# Patient Record
Sex: Male | Born: 1970 | Race: White | Hispanic: No | Marital: Married | Smoking: Former smoker
Health system: Southern US, Community
[De-identification: ages and names within clinical notes are randomized; demographics above are authoritative.]

## PROBLEM LIST (undated history)

## (undated) DIAGNOSIS — T7840XA Allergy, unspecified, initial encounter: Secondary | ICD-10-CM

## (undated) DIAGNOSIS — D869 Sarcoidosis, unspecified: Secondary | ICD-10-CM

## (undated) DIAGNOSIS — K219 Gastro-esophageal reflux disease without esophagitis: Secondary | ICD-10-CM

## (undated) DIAGNOSIS — E785 Hyperlipidemia, unspecified: Secondary | ICD-10-CM

## (undated) DIAGNOSIS — I10 Essential (primary) hypertension: Secondary | ICD-10-CM

## (undated) DIAGNOSIS — M109 Gout, unspecified: Secondary | ICD-10-CM

## (undated) HISTORY — DX: Gastro-esophageal reflux disease without esophagitis: K21.9

## (undated) HISTORY — DX: Hyperlipidemia, unspecified: E78.5

## (undated) HISTORY — DX: Essential (primary) hypertension: I10

## (undated) HISTORY — DX: Gout, unspecified: M10.9

## (undated) HISTORY — DX: Sarcoidosis, unspecified: D86.9

## (undated) HISTORY — DX: Allergy, unspecified, initial encounter: T78.40XA

---

## 1990-10-30 ENCOUNTER — Encounter: Payer: Self-pay | Admitting: Internal Medicine

## 1998-07-01 ENCOUNTER — Emergency Department (HOSPITAL_COMMUNITY): Admission: EM | Admit: 1998-07-01 | Discharge: 1998-07-01 | Payer: Self-pay | Admitting: Emergency Medicine

## 1998-07-01 ENCOUNTER — Encounter: Payer: Self-pay | Admitting: Emergency Medicine

## 2004-01-20 ENCOUNTER — Ambulatory Visit: Payer: Self-pay | Admitting: Internal Medicine

## 2004-08-30 ENCOUNTER — Ambulatory Visit: Payer: Self-pay | Admitting: Internal Medicine

## 2004-12-19 ENCOUNTER — Ambulatory Visit: Payer: Self-pay | Admitting: Internal Medicine

## 2004-12-26 ENCOUNTER — Ambulatory Visit: Payer: Self-pay | Admitting: Internal Medicine

## 2005-01-26 ENCOUNTER — Ambulatory Visit: Payer: Self-pay | Admitting: Internal Medicine

## 2005-03-14 ENCOUNTER — Ambulatory Visit: Payer: Self-pay | Admitting: Internal Medicine

## 2005-04-14 ENCOUNTER — Ambulatory Visit: Payer: Self-pay | Admitting: Internal Medicine

## 2005-04-28 ENCOUNTER — Ambulatory Visit: Payer: Self-pay | Admitting: Internal Medicine

## 2005-10-11 ENCOUNTER — Ambulatory Visit: Payer: Self-pay | Admitting: Internal Medicine

## 2005-11-01 ENCOUNTER — Ambulatory Visit: Payer: Self-pay | Admitting: Internal Medicine

## 2006-03-13 ENCOUNTER — Ambulatory Visit: Payer: Self-pay | Admitting: Internal Medicine

## 2006-05-21 ENCOUNTER — Encounter: Payer: Self-pay | Admitting: Internal Medicine

## 2006-06-06 ENCOUNTER — Emergency Department (HOSPITAL_COMMUNITY): Admission: EM | Admit: 2006-06-06 | Discharge: 2006-06-07 | Payer: Self-pay | Admitting: Emergency Medicine

## 2006-06-08 ENCOUNTER — Ambulatory Visit: Payer: Self-pay | Admitting: Internal Medicine

## 2006-08-20 ENCOUNTER — Encounter (INDEPENDENT_AMBULATORY_CARE_PROVIDER_SITE_OTHER): Payer: Self-pay | Admitting: *Deleted

## 2006-08-27 ENCOUNTER — Telehealth (INDEPENDENT_AMBULATORY_CARE_PROVIDER_SITE_OTHER): Payer: Self-pay | Admitting: *Deleted

## 2006-08-31 ENCOUNTER — Ambulatory Visit: Payer: Self-pay | Admitting: Internal Medicine

## 2006-08-31 ENCOUNTER — Encounter: Payer: Self-pay | Admitting: Family Medicine

## 2006-08-31 DIAGNOSIS — K219 Gastro-esophageal reflux disease without esophagitis: Secondary | ICD-10-CM

## 2006-09-03 LAB — CONVERTED CEMR LAB
Albumin: 4 g/dL (ref 3.5–5.2)
BUN: 17 mg/dL (ref 6–23)
CO2: 25 meq/L (ref 19–32)
Calcium: 9.3 mg/dL (ref 8.4–10.5)
Chloride: 105 meq/L (ref 96–112)
Cholesterol: 196 mg/dL (ref 0–200)
Creatinine, Ser: 1 mg/dL (ref 0.4–1.5)
GFR calc Af Amer: 108 mL/min
GFR calc non Af Amer: 89 mL/min
Glucose, Bld: 92 mg/dL (ref 70–99)
HDL: 36.7 mg/dL — ABNORMAL LOW (ref >39.0)
LDL Cholesterol: 128 mg/dL — ABNORMAL HIGH (ref 0–99)
Phosphorus: 2.2 mg/dL — ABNORMAL LOW (ref 2.3–4.6)
Potassium: 4.1 meq/L (ref 3.5–5.1)
Sodium: 139 meq/L (ref 135–145)
Total CHOL/HDL Ratio: 5.3
Triglycerides: 156 mg/dL — ABNORMAL HIGH (ref 0–149)
VLDL: 31 mg/dL (ref 0–40)

## 2006-10-04 ENCOUNTER — Telehealth: Payer: Self-pay | Admitting: Internal Medicine

## 2006-10-05 ENCOUNTER — Ambulatory Visit: Payer: Self-pay | Admitting: Internal Medicine

## 2006-10-24 ENCOUNTER — Ambulatory Visit: Payer: Self-pay | Admitting: Internal Medicine

## 2006-11-13 ENCOUNTER — Ambulatory Visit: Payer: Self-pay | Admitting: Internal Medicine

## 2006-12-31 ENCOUNTER — Ambulatory Visit: Payer: Self-pay | Admitting: Family Medicine

## 2007-01-24 ENCOUNTER — Telehealth (INDEPENDENT_AMBULATORY_CARE_PROVIDER_SITE_OTHER): Payer: Self-pay | Admitting: *Deleted

## 2007-02-27 ENCOUNTER — Telehealth: Payer: Self-pay | Admitting: Internal Medicine

## 2007-04-02 ENCOUNTER — Ambulatory Visit: Payer: Self-pay | Admitting: Internal Medicine

## 2007-04-19 ENCOUNTER — Telehealth: Payer: Self-pay | Admitting: Internal Medicine

## 2007-06-12 ENCOUNTER — Ambulatory Visit: Payer: Self-pay | Admitting: Internal Medicine

## 2007-07-30 ENCOUNTER — Telehealth: Payer: Self-pay | Admitting: Internal Medicine

## 2007-08-14 ENCOUNTER — Ambulatory Visit: Payer: Self-pay | Admitting: Internal Medicine

## 2007-10-01 ENCOUNTER — Telehealth (INDEPENDENT_AMBULATORY_CARE_PROVIDER_SITE_OTHER): Payer: Self-pay | Admitting: *Deleted

## 2007-10-03 ENCOUNTER — Telehealth: Payer: Self-pay | Admitting: Family Medicine

## 2007-10-30 ENCOUNTER — Telehealth: Payer: Self-pay | Admitting: Internal Medicine

## 2007-11-01 ENCOUNTER — Ambulatory Visit: Payer: Self-pay | Admitting: Family Medicine

## 2007-11-13 ENCOUNTER — Telehealth (INDEPENDENT_AMBULATORY_CARE_PROVIDER_SITE_OTHER): Payer: Self-pay | Admitting: Internal Medicine

## 2007-11-13 ENCOUNTER — Ambulatory Visit: Payer: Self-pay | Admitting: Family Medicine

## 2007-11-14 ENCOUNTER — Telehealth: Payer: Self-pay | Admitting: Family Medicine

## 2007-11-19 ENCOUNTER — Ambulatory Visit: Payer: Self-pay | Admitting: Internal Medicine

## 2007-11-25 ENCOUNTER — Ambulatory Visit: Payer: Self-pay | Admitting: Family Medicine

## 2008-01-07 ENCOUNTER — Telehealth: Payer: Self-pay | Admitting: Internal Medicine

## 2008-01-17 ENCOUNTER — Ambulatory Visit: Payer: Self-pay | Admitting: Family Medicine

## 2008-01-28 ENCOUNTER — Ambulatory Visit: Payer: Self-pay | Admitting: Professional

## 2008-02-11 ENCOUNTER — Telehealth: Payer: Self-pay | Admitting: Internal Medicine

## 2008-02-12 ENCOUNTER — Ambulatory Visit: Payer: Self-pay | Admitting: Internal Medicine

## 2008-03-12 ENCOUNTER — Ambulatory Visit: Payer: Self-pay | Admitting: Internal Medicine

## 2008-05-14 ENCOUNTER — Telehealth: Payer: Self-pay | Admitting: Internal Medicine

## 2008-05-18 ENCOUNTER — Telehealth: Payer: Self-pay | Admitting: Internal Medicine

## 2008-07-08 ENCOUNTER — Telehealth: Payer: Self-pay | Admitting: Internal Medicine

## 2008-07-10 ENCOUNTER — Ambulatory Visit (HOSPITAL_COMMUNITY): Admission: RE | Admit: 2008-07-10 | Discharge: 2008-07-10 | Payer: Self-pay | Admitting: Internal Medicine

## 2008-07-10 ENCOUNTER — Ambulatory Visit: Payer: Self-pay | Admitting: Internal Medicine

## 2008-07-10 ENCOUNTER — Telehealth: Payer: Self-pay | Admitting: Internal Medicine

## 2008-08-05 ENCOUNTER — Telehealth: Payer: Self-pay | Admitting: Internal Medicine

## 2008-09-01 ENCOUNTER — Encounter: Payer: Self-pay | Admitting: Internal Medicine

## 2008-09-18 ENCOUNTER — Ambulatory Visit: Payer: Self-pay | Admitting: Internal Medicine

## 2008-09-21 ENCOUNTER — Encounter: Payer: Self-pay | Admitting: Internal Medicine

## 2008-09-22 LAB — CONVERTED CEMR LAB
BUN: 16 mg/dL (ref 6–23)
CO2: 28 meq/L (ref 19–32)
Calcium: 9 mg/dL (ref 8.4–10.5)
Chloride: 108 meq/L (ref 96–112)
Creatinine, Ser: 1 mg/dL (ref 0.4–1.5)
GFR calc non Af Amer: 87.8 mL/min (ref >60)
Glucose, Bld: 96 mg/dL (ref 70–99)
Potassium: 3.6 meq/L (ref 3.5–5.1)
Sodium: 141 meq/L (ref 135–145)

## 2008-10-05 ENCOUNTER — Telehealth: Payer: Self-pay | Admitting: Internal Medicine

## 2008-10-07 LAB — CONVERTED CEMR LAB: IgE (Immunoglobulin E), Serum: 136.9 intl units/mL (ref 0.0–180.0)

## 2008-10-08 ENCOUNTER — Telehealth: Payer: Self-pay | Admitting: Internal Medicine

## 2008-10-30 ENCOUNTER — Ambulatory Visit: Payer: Self-pay | Admitting: Internal Medicine

## 2008-11-12 ENCOUNTER — Telehealth: Payer: Self-pay | Admitting: Internal Medicine

## 2008-11-23 ENCOUNTER — Ambulatory Visit: Payer: Self-pay | Admitting: Internal Medicine

## 2008-12-18 ENCOUNTER — Telehealth: Payer: Self-pay | Admitting: Internal Medicine

## 2009-01-01 ENCOUNTER — Ambulatory Visit: Payer: Self-pay | Admitting: Internal Medicine

## 2009-01-05 ENCOUNTER — Encounter: Payer: Self-pay | Admitting: Internal Medicine

## 2009-01-05 LAB — CONVERTED CEMR LAB: IgE (Immunoglobulin E), Serum: 146.5 intl units/mL (ref 0.0–180.0)

## 2009-01-07 ENCOUNTER — Telehealth: Payer: Self-pay | Admitting: Internal Medicine

## 2009-01-18 ENCOUNTER — Telehealth: Payer: Self-pay | Admitting: Internal Medicine

## 2009-01-20 ENCOUNTER — Telehealth (INDEPENDENT_AMBULATORY_CARE_PROVIDER_SITE_OTHER): Payer: Self-pay | Admitting: *Deleted

## 2009-01-26 ENCOUNTER — Ambulatory Visit: Payer: Self-pay | Admitting: Internal Medicine

## 2009-02-02 ENCOUNTER — Telehealth (INDEPENDENT_AMBULATORY_CARE_PROVIDER_SITE_OTHER): Payer: Self-pay | Admitting: *Deleted

## 2009-02-17 ENCOUNTER — Ambulatory Visit: Payer: Self-pay | Admitting: Internal Medicine

## 2009-02-18 LAB — CONVERTED CEMR LAB
ALT: 66 units/L — ABNORMAL HIGH (ref 0–53)
AST: 46 units/L — ABNORMAL HIGH (ref 0–37)
Albumin: 4.4 g/dL (ref 3.5–5.2)
Alkaline Phosphatase: 41 units/L (ref 39–117)
BUN: 18 mg/dL (ref 6–23)
Basophils Absolute: 0 10*3/uL (ref 0.0–0.1)
Basophils Relative: 0.3 % (ref 0.0–3.0)
Bilirubin, Direct: 0 mg/dL (ref 0.0–0.3)
CO2: 27 meq/L (ref 19–32)
Calcium: 10 mg/dL (ref 8.4–10.5)
Chloride: 104 meq/L (ref 96–112)
Creatinine, Ser: 1.1 mg/dL (ref 0.4–1.5)
Eosinophils Absolute: 0 10*3/uL (ref 0.0–0.7)
Eosinophils Relative: 0.7 % (ref 0.0–5.0)
GFR calc non Af Amer: 78.49 mL/min (ref >60)
Glucose, Bld: 119 mg/dL — ABNORMAL HIGH (ref 70–99)
HCT: 45 % (ref 39.0–52.0)
Hemoglobin: 15 g/dL (ref 13.0–17.0)
Lymphocytes Relative: 21.8 % (ref 12.0–46.0)
Lymphs Abs: 1.3 10*3/uL (ref 0.7–4.0)
MCHC: 33.4 g/dL (ref 30.0–36.0)
MCV: 93.1 fL (ref 78.0–100.0)
Monocytes Absolute: 0.7 10*3/uL (ref 0.1–1.0)
Monocytes Relative: 12.3 % — ABNORMAL HIGH (ref 3.0–12.0)
Neutro Abs: 3.9 10*3/uL (ref 1.4–7.7)
Neutrophils Relative %: 64.9 % (ref 43.0–77.0)
Phosphorus: 2.4 mg/dL (ref 2.3–4.6)
Platelets: 210 10*3/uL (ref 150.0–400.0)
Potassium: 4.1 meq/L (ref 3.5–5.1)
RBC: 4.84 M/uL (ref 4.22–5.81)
RDW: 13.9 % (ref 11.5–14.6)
Sodium: 138 meq/L (ref 135–145)
TSH: 1.17 microintl units/mL (ref 0.35–5.50)
Total Bilirubin: 1 mg/dL (ref 0.3–1.2)
Total Protein: 6.9 g/dL (ref 6.0–8.3)
WBC: 5.9 10*3/uL (ref 4.5–10.5)

## 2009-03-08 ENCOUNTER — Ambulatory Visit: Payer: Self-pay | Admitting: Internal Medicine

## 2009-03-08 ENCOUNTER — Telehealth: Payer: Self-pay | Admitting: Internal Medicine

## 2009-03-08 ENCOUNTER — Telehealth (INDEPENDENT_AMBULATORY_CARE_PROVIDER_SITE_OTHER): Payer: Self-pay | Admitting: *Deleted

## 2009-03-10 ENCOUNTER — Telehealth (INDEPENDENT_AMBULATORY_CARE_PROVIDER_SITE_OTHER): Payer: Self-pay | Admitting: *Deleted

## 2009-03-11 ENCOUNTER — Ambulatory Visit: Payer: Self-pay | Admitting: Internal Medicine

## 2009-03-11 DIAGNOSIS — R7401 Elevation of levels of liver transaminase levels: Secondary | ICD-10-CM | POA: Insufficient documentation

## 2009-03-11 DIAGNOSIS — R740 Nonspecific elevation of levels of transaminase and lactic acid dehydrogenase [LDH]: Secondary | ICD-10-CM

## 2009-03-13 LAB — CONVERTED CEMR LAB
HCV Ab: NEGATIVE
Hep A IgM: NEGATIVE
Hep B C IgM: NEGATIVE
Hepatitis B Surface Ag: NEGATIVE

## 2009-03-15 LAB — CONVERTED CEMR LAB
ALT: 68 units/L — ABNORMAL HIGH (ref 0–53)
AST: 38 units/L — ABNORMAL HIGH (ref 0–37)
Albumin: 4.3 g/dL (ref 3.5–5.2)
Alkaline Phosphatase: 45 units/L (ref 39–117)
Bilirubin, Direct: 0.1 mg/dL (ref 0.0–0.3)
Glucose, Bld: 107 mg/dL — ABNORMAL HIGH (ref 70–99)
Hgb A1c MFr Bld: 5.5 % (ref 4.6–6.5)
Total Bilirubin: 0.8 mg/dL (ref 0.3–1.2)
Total Protein: 7.2 g/dL (ref 6.0–8.3)

## 2009-04-08 ENCOUNTER — Ambulatory Visit: Payer: Self-pay | Admitting: Internal Medicine

## 2009-05-27 ENCOUNTER — Ambulatory Visit: Payer: Self-pay | Admitting: Internal Medicine

## 2009-06-28 ENCOUNTER — Encounter: Payer: Self-pay | Admitting: Internal Medicine

## 2009-06-29 ENCOUNTER — Encounter: Payer: Self-pay | Admitting: Internal Medicine

## 2009-07-02 ENCOUNTER — Ambulatory Visit: Payer: Self-pay | Admitting: Internal Medicine

## 2009-07-05 ENCOUNTER — Telehealth (INDEPENDENT_AMBULATORY_CARE_PROVIDER_SITE_OTHER): Payer: Self-pay | Admitting: *Deleted

## 2009-07-09 ENCOUNTER — Ambulatory Visit: Payer: Self-pay | Admitting: Internal Medicine

## 2009-07-12 ENCOUNTER — Telehealth: Payer: Self-pay | Admitting: Internal Medicine

## 2009-07-21 ENCOUNTER — Telehealth: Payer: Self-pay | Admitting: Internal Medicine

## 2009-07-23 ENCOUNTER — Ambulatory Visit: Payer: Self-pay | Admitting: Internal Medicine

## 2009-07-30 ENCOUNTER — Ambulatory Visit: Payer: Self-pay | Admitting: Internal Medicine

## 2009-08-20 ENCOUNTER — Telehealth: Payer: Self-pay | Admitting: Internal Medicine

## 2009-08-25 ENCOUNTER — Encounter: Payer: Self-pay | Admitting: Internal Medicine

## 2009-09-01 ENCOUNTER — Ambulatory Visit: Payer: Self-pay | Admitting: Internal Medicine

## 2009-09-01 DIAGNOSIS — J309 Allergic rhinitis, unspecified: Secondary | ICD-10-CM | POA: Insufficient documentation

## 2009-09-10 ENCOUNTER — Ambulatory Visit: Payer: Self-pay | Admitting: Internal Medicine

## 2009-09-16 ENCOUNTER — Encounter: Payer: Self-pay | Admitting: Internal Medicine

## 2009-10-11 ENCOUNTER — Telehealth (INDEPENDENT_AMBULATORY_CARE_PROVIDER_SITE_OTHER): Payer: Self-pay | Admitting: *Deleted

## 2009-10-20 ENCOUNTER — Telehealth: Payer: Self-pay | Admitting: Internal Medicine

## 2009-11-12 ENCOUNTER — Encounter: Payer: Self-pay | Admitting: Internal Medicine

## 2009-11-25 ENCOUNTER — Telehealth: Payer: Self-pay | Admitting: Internal Medicine

## 2010-01-21 ENCOUNTER — Ambulatory Visit: Payer: Self-pay | Admitting: Internal Medicine

## 2010-02-11 ENCOUNTER — Encounter: Payer: Self-pay | Admitting: Internal Medicine

## 2010-02-11 ENCOUNTER — Ambulatory Visit: Payer: Self-pay | Admitting: Internal Medicine

## 2010-02-11 DIAGNOSIS — F411 Generalized anxiety disorder: Secondary | ICD-10-CM | POA: Insufficient documentation

## 2010-03-21 ENCOUNTER — Encounter: Payer: Self-pay | Admitting: Internal Medicine

## 2010-03-22 NOTE — Medication Information (Signed)
Summary: [facility]  [facility]   Imported By: Sherian Rein 08/30/2009 10:21:16  _____________________________________________________________________  External Attachment:    Type:   Image     Comment:   External Document

## 2010-03-22 NOTE — Progress Notes (Signed)
Summary: MZ alpha 1  Phone Note Outgoing Call   Summary of Call: called and spoke to patient: states he is doing okay from breathing stand point. Reports lack of insurance for [employer]. So he is working on it through [facility] and is on verge of getting it back. Explained alpha 1 result of MZ. and what it means. He wil come for repeat testing.   Candise Bowens, you have to call him for it Initial call taken by: Kalman Shan MD,  October 20, 2009 1:49 PM  Follow-up for Phone Call        spoke to pt and he will come by on 11-05-09 at 1:30 and have Alpha-1 test drawn.  I have paperwork and box at my desk. Carron Curie CMA  October 22, 2009 1:14 PM  pt came by on 11-12-09 and had lab drawn. Carron Curie CMA  November 12, 2009 1:52 PM
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
## 2010-03-22 NOTE — Assessment & Plan Note (Signed)
Summary: xolair/ [facility]bw   Nurse Visit   Allergies: No Known Drug Allergies  Medication Administration  Injection # 1:    Medication: Xolair (omalizumab) 150mg     Diagnosis: EXTRINSIC ASTHMA, UNSPECIFIED (ICD-493.00)    Route: SQ    Site: R deltoid    Exp Date: 02/2012    Lot #: [id number]    Mfr: Mendel Ryder    Comments: Injection given by Dimas Millin in allergy lab. Xolair 300mg . 1.21ml x 1 in Right and Left Deltoid. Pt waited 30 minutes.     Patient tolerated injection without complications  Orders Added: 1)  Admin of patients own med IM/SQ [[id number]]

## 2010-03-22 NOTE — Assessment & Plan Note (Signed)
Summary: xolair/ mbw   Nurse Visit   Allergies: No Known Drug Allergies  Medication Administration  Injection # 1:    Medication: Xolair (omalizumab) 150mg     Diagnosis: EXTRINSIC ASTHMA, UNSPECIFIED (ICD-493.00)    Route: SQ    Site: L deltoid    Exp Date: 04/22/2012    Lot #: [id number]    Mfr: GENENTECH    Comments: 1.2 ML IN LEFT AND RIGHT ARM PT WAITED 30 MINS    Patient tolerated injection without complications    Given by: TAMMY SCOTT IN ALLERGY LAB  Orders Added: 1)  Administration xolair injection [id number]   Medication Administration  Injection # 1:    Medication: Xolair (omalizumab) 150mg     Diagnosis: EXTRINSIC ASTHMA, UNSPECIFIED (ICD-493.00)    Route: SQ    Site: L deltoid    Exp Date: 04/22/2012    Lot #: [id number]    Mfr: GENENTECH    Comments: 1.2 ML IN LEFT AND RIGHT ARM PT WAITED 30 MINS    Patient tolerated injection without complications    Given by: TAMMY SCOTT IN ALLERGY LAB  Orders Added: 1)  Administration xolair injection [[id number]]

## 2010-03-22 NOTE — Progress Notes (Signed)
Summary: RX Refill on AMOXICILLIN  Phone Note Refill Request Message from:  Patient on November 13, 2007 1:20 PM  Refills Requested: Medication #1:  AMOXICILLIN 500 MG CAPS 2 caps two times a day for bronchitis by mouth Pt filled out Triage form when he was here seeing Dr. Patsy Lager today asking for refill on this medication stating on form: "Have not been able to get over Bronchitis." [facility]   Method Requested: Electronic Initial call taken by: Mickle Asper,  November 13, 2007 1:22 PM  Follow-up for Phone Call        will change to z pac as not resolved on 7d of amoxicillin.  Rx attatched   Mcarthur Rossetti Katesha Eichel FNP  November 13, 2007 1:30 PM   Additional Follow-up for Phone Call Additional follow up Details #1::        med phoned to pharmacy. msg left on home ans mach rx @ pharm. Additional Follow-up by: Cooper Render,  November 13, 2007 2:03 PM    New/Updated Medications: ZITHROMAX Z-PAK 250 MG TABS (AZITHROMYCIN) use as directed by mouth   Prescriptions: ZITHROMAX Z-PAK 250 MG TABS (AZITHROMYCIN) use as directed by mouth  #1 x 0   Entered and Authorized by:   Gildardo Griffes FNP   Signed by:   Gildardo Griffes FNP on 11/13/2007   Method used:   Telephoned to ...       [facility] (mail-order)             [place], [place]  [address]       Ph: [phone]       Fax:    RxID:   [id number]
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
## 2010-03-22 NOTE — Progress Notes (Signed)
Summary: sob-appt sched  Phone Note Call from Patient Call back at [phone]   Caller: Spouse-[name] Shimabukuro Call For: ramaswamy Reason for Call: Talk to Nurse Summary of Call: has been taking xolair, having a real hard time breathing - not sure if it is allergies or the xolair shot not working.  Please advise. Initial call taken by: Eugene Gavia,  Jul 05, 2009 4:52 PM  Follow-up for Phone Call        Spoke with pt's spouse Waynetta Sandy, she states that x 3 days, pt has been having increased SOB and some chest tightness.  She questions whether or not this is related to allergies or not.  She states that he has been using rescue more frequently. I advised needs ov- pt last seen in Dec 2010 and told to followup 2 months after.  OV with MR sched for 5/20 at 4:30 pm.  Advised that pt report to ER or UC sooner if needed, or call for sooner appt.  Pt's spouse verbalized understanding. Follow-up by: Vernie Murders,  Jul 05, 2009 5:16 PM

## 2010-03-22 NOTE — Assessment & Plan Note (Signed)
Summary: xolair/apc   Nurse Visit   Allergies: No Known Drug Allergies  Medication Administration  Injection # 1:    Medication: Xolair (omalizumab) 150mg     Diagnosis: EXTRINSIC ASTHMA, UNSPECIFIED (ICD-493.00)    Route: SQ    Site: R deltoid    Exp Date: 02/2012    Lot #: [id number]    Mfr: Mendel Ryder    Comments: Injection given by Drucie Opitz, CMA in allergy lab. Xolair 300mg . 1.28ml x 1 in Right and Left Deltoid. Pt waited 30 minutes.     Patient tolerated injection without complications  Orders Added: 1)  Admin of patients own med IM/SQ [[id number]]

## 2010-03-22 NOTE — Medication Information (Signed)
Summary: Xolair Request Form/[employer]  Xolair Request Form/[employer]   Imported By: Sherian Rein 07/02/2009 10:01:36  _____________________________________________________________________  External Attachment:    Type:   Image     Comment:   External Document
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
## 2010-03-22 NOTE — Progress Notes (Signed)
Summary: rx  Phone Note From Pharmacy Call back at [phone]   Caller: [facility]* Call For: ramaswamy  Summary of Call: waiting on rx for Flonaise and Singulair for pt.  Requested by pt in 03/08/2009 phone note.  Please respond. Initial call taken by: Eugene Gavia,  March 10, 2009 2:35 PM  Follow-up for Phone Call        called [facility], spoke with Rob-Pharmacist.  Informed Rob that Dr. Alphonsus Sias has been prescribing singular and flonase for pt-not MR.  Rob verbalized understanding and stated he would call Dr. Alphonsus Sias to refill these.   Follow-up by: Gweneth Dimitri RN,  March 10, 2009 2:46 PM

## 2010-03-22 NOTE — Assessment & Plan Note (Signed)
Summary: xolair 1 month///kp   Nurse Visit   Allergies: No Known Drug Allergies  Medication Administration  Injection # 1:    Medication: Xolair (omalizumab) 150mg     Diagnosis: EXTRINSIC ASTHMA, UNSPECIFIED (ICD-493.00)    Route: SQ    Site: R deltoid    Exp Date: 04/20/2012    Lot #: [id number]    Mfr: GENENTECH    Comments: 1.2 ML IN RIGHT AND LEFT ARM PT WAITED 20 MINS    Patient tolerated injection without complications    Given by: SUSANNE FORD IN [facility]   Medication Administration  Injection # 1:    Medication: Xolair (omalizumab) 150mg     Diagnosis: EXTRINSIC ASTHMA, UNSPECIFIED (ICD-493.00)    Route: SQ    Site: R deltoid    Exp Date: 04/20/2012    Lot #: [id number]    Mfr: GENENTECH    Comments: 1.2 ML IN RIGHT AND LEFT ARM PT WAITED 20 MINS    Patient tolerated injection without complications    Given by: SUSANNE FORD IN [facility]

## 2010-03-22 NOTE — Progress Notes (Signed)
Summary: nos appt  Phone Note Call from Patient   Caller: [email or web address]  Call For: Velia Pamer Summary of Call: Rsc nos from 6/30 to 7/13 @ 4:30p. Initial call taken by: Darletta Moll,  August 20, 2009 10:19 AM

## 2010-03-22 NOTE — Progress Notes (Signed)
Summary: switching pharms  Phone Note Call from Patient Call back at Home Phone [phone]   Caller: Patient Call For: ramaswamy Reason for Call: Talk to Nurse Summary of Call: pt switching pharmacies - needs all meds called into [facility] Spiriva, Flonaise, Advair 500/50, Panaprosol, Diazepam, Cetriline, Singulair Initial call taken by: Eugene Gavia,  March 08, 2009 2:52 PM  Follow-up for Phone Call        Spoke with pt and made and made aware that rxs were refilled that MR originally rxed.  The other meds will need to be refilled by PCP.  Pt verbalized understanding. Follow-up by: Vernie Murders,  March 08, 2009 3:08 PM    New/Updated Medications: SPIRIVA HANDIHALER 18 MCG CAPS (TIOTROPIUM BROMIDE MONOHYDRATE) inhale contents of 1 capsule daily Prescriptions: SPIRIVA HANDIHALER 18 MCG CAPS (TIOTROPIUM BROMIDE MONOHYDRATE) inhale contents of 1 capsule daily  #30 x 6   Entered by:   Vernie Murders   Authorized by:   Kalman Shan MD   Signed by:   Vernie Murders on 03/08/2009   Method used:   Electronically to        [facility]* (retail)       [address]       [place], [place]  [address]       Ph: [phone]       Fax: [phone]   RxID:   [id number] ADVAIR DISKUS 500-50 MCG/DOSE AEPB (FLUTICASONE-SALMETEROL) one puff twice a day  #1 x 6   Entered by:   Vernie Murders   Authorized by:   Kalman Shan MD   Signed by:   Vernie Murders on 03/08/2009   Method used:   Electronically to        [facility]* (retail)       [address]       [place], [place]  [address]       Ph: [phone]       Fax: [phone]   RxID:   [id number] EPIPEN 2-PAK 0.3 MG/0.3ML DEVI (EPINEPHRINE) use as directed  #1 x 6   Entered by:   Vernie Murders   Authorized by:   Kalman Shan MD   Signed by:   Vernie Murders on 03/08/2009   Method used:   Electronically to        [facility]* (retail)       [address]       [place], [place]  [address]     Ph: [phone]       Fax: [phone]   RxID:   [id number]

## 2010-03-22 NOTE — Progress Notes (Signed)
Summary: needs f/u appt  Phone Note Call from Patient   Caller: Spouse Call For: Navi Erber Summary of Call: per spouse beth, pt had to cancel appt for today as he is out of town. MR's next avail is 6/23. spouse says pt was to see MR after completing 12 days of prednisone. pt is to return and have spirometry. can pt see another dr for this or can MR be overbooked? call beth at [phone] Initial call taken by: Tivis Ringer, CNA,  July 21, 2009 12:58 PM  Follow-up for Phone Call        MR, please advise if you want to overbook to see this pt or do you wish for him to followup with TP? Thanks! Vernie Murders  July 21, 2009 2:21 PM   Additional Follow-up for Phone Call Additional follow up Details #1::        he can see TP. He needs the spiro though between day 10 - 15 of the prednisone Additional Follow-up by: Kalman Shan MD,  July 21, 2009 2:24 PM    Additional Follow-up for Phone Call Additional follow up Details #2::    PT SCHEDULED TO SEE TP ON FRIDAY 07-23-09 AT 3:30. Carron Curie CMA  July 21, 2009 2:34 PM

## 2010-03-22 NOTE — Progress Notes (Signed)
Summary: Rx SERTRALINE & Diazepam  Phone Note Refill Request Call back at [phone] Message from:  Patient on March 08, 2009 4:20 PM  Refills Requested: Medication #1:  SERTRALINE HCL 50 MG TABS Take 1 tablet by mouth once a day  Medication #2:  DIAZEPAM 5 MG  TABS 1 three times a day as needed for nerves Patient is switching pharmacies. He needs his meds sent to [facility].    Method Requested: Electronic Initial call taken by: Melody Comas,  March 08, 2009 4:22 PM  Follow-up for Phone Call        sertraline sent for a year  Okay to phone in diazepam #90 x 1 Follow-up by: Cindee Salt MD,  March 08, 2009 6:11 PM  Additional Follow-up for Phone Call Additional follow up Details #1::        Rx for Diazepam called in to [facility] Additional Follow-up by: Linde Gillis CMA Duncan Dull),  March 09, 2009 7:58 AM    Prescriptions: SERTRALINE HCL 50 MG TABS (SERTRALINE HCL) Take 1 tablet by mouth once a day  #30 x 12   Entered and Authorized by:   Cindee Salt MD   Signed by:   Cindee Salt MD on 03/08/2009   Method used:   Electronically to        [facility]* (retail)       [address]       [place], [place]  [address]       Ph: [phone]       Fax: [phone]   RxID:   [id number]

## 2010-03-22 NOTE — Assessment & Plan Note (Signed)
Summary: xoliar/cb   Nurse Visit   Allergies: No Known Drug Allergies  Medication Administration  Injection # 1:    Medication: Xolair (omalizumab) 150mg     Diagnosis: EXTRINSIC ASTHMA, UNSPECIFIED (ICD-493.00)    Route: SQ    Site: R deltoid    Exp Date: 09/2012    Lot #: [id number]    Mfr: Salome Spotted    Comments: 1.2 ML IN RIGHT AND LEFT ARM 300MG  PT WAITED CHARGED [phone]     Patient tolerated injection without complications    Given by: SUSANNE FORD IN [facility]  Orders Added: 1)  Administration xolair injection [id number]   Medication Administration  Injection # 1:    Medication: Xolair (omalizumab) 150mg     Diagnosis: EXTRINSIC ASTHMA, UNSPECIFIED (ICD-493.00)    Route: SQ    Site: R deltoid    Exp Date: 09/2012    Lot #: [id number]    Mfr: Salome Spotted    Comments: 1.2 ML IN RIGHT AND LEFT ARM 300MG  PT WAITED CHARGED [phone]     Patient tolerated injection without complications    Given by: SUSANNE FORD IN [facility]  Orders Added: 1)  Administration xolair injection [phone]

## 2010-03-22 NOTE — Letter (Signed)
Summary: [facility] for Xolair[facility]  [facility] for Xolair[facility]   Imported By: Sherian Rein 09/28/2009 13:45:31  _____________________________________________________________________  External Attachment:    Type:   Image     Comment:   External Document

## 2010-03-22 NOTE — Miscellaneous (Signed)
Summary: Orders Update   Clinical Lists Changes       Medication Administration  Injection # 1:    Medication: Xolair (omalizumab) 150mg     Diagnosis: 493.00    Route: SQ    Site: L deltoid    Exp Date: 08/2012    Lot #: [id number]    Mfr: Salome Spotted    Comments: 1.2 ml in left and right arm charged 96401    Given by: TAMMY SCOTT IN [facility] LAB

## 2010-03-22 NOTE — Assessment & Plan Note (Signed)
Summary: rov/alpha 1 test/MS   Primary Provider/Referring Provider:  Cindee Salt MD   History of Present Illness: alpha 1 test  Allergies: No Known Drug Allergies   Other Orders: No Charge Patient Arrived (NCPA0) (NCPA0)

## 2010-03-22 NOTE — Medication Information (Signed)
Summary: Omalizumab inject Approved/[employer]  Omalizumab inject Approved/[employer]   Imported By: Sherian Rein 07/02/2009 10:00:18  _____________________________________________________________________  External Attachment:    Type:   Image     Comment:   External Document

## 2010-03-22 NOTE — Progress Notes (Signed)
Summary: Diazepam  Phone Note Refill Request Message from:  Fax from Pharmacy on Jul 12, 2009 11:40 AM  Refills Requested: Medication #1:  DIAZEPAM 5 MG  TABS 1 three times a day as needed for nerves [facility]  Phone:   [phone]   Method Requested: Telephone to Pharmacy Initial call taken by: Delilah Shan CMA Duncan Dull),  Jul 12, 2009 11:41 AM  Follow-up for Phone Call        okay #90 x 1 Follow-up by: Cindee Salt MD,  Jul 12, 2009 1:45 PM  Additional Follow-up for Phone Call Additional follow up Details #1::        Rx called to pharmacy Additional Follow-up by: Mervin Hack CMA Duncan Dull),  Jul 12, 2009 3:20 PM    Prescriptions: DIAZEPAM 5 MG  TABS (DIAZEPAM) 1 three times a day as needed for nerves  #90 x 1   Entered by:   Mervin Hack CMA (AAMA)   Authorized by:   Cindee Salt MD   Signed by:   Mervin Hack CMA (AAMA) on 07/12/2009   Method used:   Telephoned to ...       [facility]* (retail)       [address]       [place], [place]  [address]       Ph: [phone]       Fax: [phone]   RxID:   [id number]

## 2010-03-22 NOTE — Progress Notes (Signed)
Summary: diazepam  Phone Note Refill Request Message from:  Fax from Pharmacy on November 25, 2009 8:52 AM  Refills Requested: Medication #1:  DIAZEPAM 5 MG  TABS 1 three times a day as needed for nerves   Last Refilled: 09/13/2009 Refill request from [facility]. [phone]. Form is on your desk.   Initial call taken by: Melody Comas,  November 25, 2009 8:53 AM  Follow-up for Phone Call        okay #90 x 1  Please have him schedule a physical in 3-4 months Follow-up by: Cindee Salt MD,  November 25, 2009 1:23 PM  Additional Follow-up for Phone Call Additional follow up Details #1::        Rx faxed to pharmacy, left message on machine at home for patient to call the office to schedule appt. Additional Follow-up by: Mervin Hack CMA Duncan Dull),  November 25, 2009 2:23 PM    Prescriptions: DIAZEPAM 5 MG  TABS (DIAZEPAM) 1 three times a day as needed for nerves  #90 x 1   Entered by:   Mervin Hack CMA (AAMA)   Authorized by:   Cindee Salt MD   Signed by:   Mervin Hack CMA (AAMA) on 11/25/2009   Method used:   Handwritten   RxID:   [id number]

## 2010-03-22 NOTE — Assessment & Plan Note (Signed)
Summary: xolair/jd   Nurse Visit   Allergies: No Known Drug Allergies  Medication Administration  Injection # 1:    Medication: Xolair (omalizumab) 150mg     Diagnosis: EXTRINSIC ASTHMA, UNSPECIFIED (ICD-493.00)    Route: SQ    Site: R deltoid    Exp Date: 06/20/2012    Lot #: [id number]    Mfr: genentech    Comments: 1.2ML IN RIGHT ARM AND LEFT ARM PT WAITED 30 MINS    Patient tolerated injection without complications    Given by: TAMMY SCOTT IN [facility]  Orders Added: 1)  Administration xolair injection [id number]   Medication Administration  Injection # 1:    Medication: Xolair (omalizumab) 150mg     Diagnosis: EXTRINSIC ASTHMA, UNSPECIFIED (ICD-493.00)    Route: SQ    Site: R deltoid    Exp Date: 06/20/2012    Lot #: [id number]    Mfr: genentech    Comments: 1.2ML IN RIGHT ARM AND LEFT ARM PT WAITED 30 MINS    Patient tolerated injection without complications    Given by: TAMMY SCOTT IN [facility]  Orders Added: 1)  Administration xolair injection [[id number]]

## 2010-03-22 NOTE — Progress Notes (Signed)
Summary: medication question  Phone Note Call from Patient Call back at [phone]   Caller: Spouse//beth Call For: ramaswamy Summary of Call: Has question about her husband's symbicort. Initial call taken by: Darletta Moll,  October 11, 2009 2:42 PM  Follow-up for Phone Call        Called and spoke with [facility].  She states that pt lost his symbicrt savings card.  I left one up front for her to pick up. Follow-up by: Vernie Murders,  October 11, 2009 2:49 PM
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
## 2010-03-30 NOTE — Letter (Signed)
SummaryScience writer Pulmonary Care Appointment Letter  [facility]  [address]   [place], [place] [address]   Phone: [phone]  Fax: [phone]    03/21/2010 MRN: [id number]  RICAHRD SCHWAGER [address] [place], [place]  [address]  Dear Mr. Gorelik,   Please call our office at [phone] to schedule this appointment with [facility].  Our registration staff is prepared to assist you with any questions you may have.    Thank you,   Nature conservation officer Pulmonary Division

## 2010-04-19 ENCOUNTER — Telehealth: Payer: Self-pay | Admitting: Internal Medicine

## 2010-04-28 NOTE — Progress Notes (Signed)
Summary: diazepam  Phone Note Refill Request Message from:  Fax from Pharmacy on April 19, 2010 1:07 PM  Refills Requested: Medication #1:  DIAZEPAM 5 MG  TABS 1 three times a day as needed for nerves   Last Refilled: 01/31/2010  Medication #2:  EPIPEN 2-PAK 0.3 MG/0.3ML DEVI use as directed   Last Refilled: 03/08/2009 Refill request from [facility]. [phone]. Fax is on your desk.  Initial call taken by: Melody Comas,  April 19, 2010 1:07 PM  Follow-up for Phone Call        okay #90 x 0 for diazepam  okay #2 x 1 for epi-pen  Due for physical have him set up in the next few months Follow-up by: Cindee Salt MD,  April 19, 2010 2:03 PM  Additional Follow-up for Phone Call Additional follow up Details #1::        Rx faxed to pharmacy, left message on home number that pt needs to set up cpx in the next couple of months. Additional Follow-up by: Mervin Hack CMA Duncan Dull),  April 19, 2010 2:55 PM    Prescriptions: EPIPEN 2-PAK 0.3 MG/0.3ML DEVI (EPINEPHRINE) use as directed  #1 x 1   Entered by:   Mervin Hack CMA (AAMA)   Authorized by:   Cindee Salt MD   Signed by:   Mervin Hack CMA (AAMA) on 04/19/2010   Method used:   Handwritten   RxID:   [id number] DIAZEPAM 5 MG  TABS (DIAZEPAM) 1 three times a day as needed for nerves  #90 x 0   Entered by:   Mervin Hack CMA (AAMA)   Authorized by:   Cindee Salt MD   Signed by:   Mervin Hack CMA (AAMA) on 04/19/2010   Method used:   Handwritten   RxID:   [id number]   Appended Document: diazepam per patient on 02/11/2010 visit pt was told to follow-up in 1 year for physical, should he still schedule CPX?  Appended Document: diazepam yes, his last physical was 12/10 when I check records  Appended Document: diazepam Patient Advised.

## 2010-06-22 ENCOUNTER — Telehealth: Payer: Self-pay | Admitting: *Deleted

## 2010-06-22 NOTE — Telephone Encounter (Signed)
Prior auth is needed for singulair, form is on your desk. 

## 2010-06-23 NOTE — Telephone Encounter (Signed)
Form done 

## 2010-06-23 NOTE — Telephone Encounter (Signed)
Form faxed back, copied and original given to Jacki Cones

## 2010-07-04 ENCOUNTER — Other Ambulatory Visit: Payer: Self-pay | Admitting: *Deleted

## 2010-07-04 MED ORDER — DIAZEPAM 5 MG PO TABS: ORAL_TABLET | ORAL | Status: DC

## 2010-07-04 NOTE — Telephone Encounter (Signed)
Okay #45 x 0 He must make appt for his physical, or at least an appt, for soon

## 2010-07-04 NOTE — Telephone Encounter (Signed)
rx called into pharmacy, spoke with patient about scheduling appt and per pt he's not due to come in until 01/2011? Pt states Dr.Letvak told him to follow-up in a year, per pt everytime he calls for refill he has to go thru this same conversation, please advise.

## 2010-07-05 MED ORDER — DIAZEPAM 5 MG PO TABS: ORAL_TABLET | ORAL | Status: DC
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
## 2010-07-05 NOTE — Telephone Encounter (Signed)
Actually he is right Last note in December did say 1 year----I think I was looking at an earlier note Okay #90 x 0 Due for physical in December Let him know I am sorry for the error

## 2010-07-05 NOTE — Telephone Encounter (Signed)
rx called into pharmacy, left message on pt's cell phone voicemail with results.
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
## 2010-09-29 ENCOUNTER — Telehealth: Payer: Self-pay | Admitting: *Deleted

## 2010-09-30 NOTE — Telephone Encounter (Signed)
Chart opened in error

## 2010-10-31 ENCOUNTER — Other Ambulatory Visit: Payer: Self-pay | Admitting: *Deleted

## 2010-10-31 MED ORDER — DIAZEPAM 5 MG PO TABS: ORAL_TABLET | ORAL | Status: DC

## 2010-10-31 NOTE — Telephone Encounter (Signed)
rx faxed to pharmacy manually  

## 2010-10-31 NOTE — Telephone Encounter (Signed)
Okay #[phone] x [phone] Please have him set up physical in the next few months

## 2010-10-31 NOTE — Telephone Encounter (Signed)
Form on your desk Last refilled 08/22/10

## 2010-11-03 ENCOUNTER — Other Ambulatory Visit: Payer: Self-pay | Admitting: *Deleted

## 2010-11-03 NOTE — Telephone Encounter (Signed)
Diazepam called to [facility], they said they didn't get the faxed refill.

## 2010-11-04 ENCOUNTER — Encounter: Payer: Self-pay | Admitting: Internal Medicine

## 2010-11-04 ENCOUNTER — Encounter: Payer: Self-pay | Admitting: *Deleted

## 2010-11-04 ENCOUNTER — Ambulatory Visit (INDEPENDENT_AMBULATORY_CARE_PROVIDER_SITE_OTHER): Payer: BC Managed Care – PPO | Admitting: Internal Medicine

## 2010-11-04 DIAGNOSIS — R0683 Snoring: Secondary | ICD-10-CM

## 2010-11-04 DIAGNOSIS — J45909 Unspecified asthma, uncomplicated: Secondary | ICD-10-CM

## 2010-11-04 DIAGNOSIS — R0602 Shortness of breath: Secondary | ICD-10-CM

## 2010-11-04 DIAGNOSIS — J4540 Moderate persistent asthma, uncomplicated: Secondary | ICD-10-CM

## 2010-11-04 DIAGNOSIS — R0609 Other forms of dyspnea: Secondary | ICD-10-CM

## 2010-11-04 DIAGNOSIS — E669 Obesity, unspecified: Secondary | ICD-10-CM

## 2010-11-04 DIAGNOSIS — R05 Cough: Secondary | ICD-10-CM

## 2010-11-04 MED ORDER — OMEPRAZOLE-SODIUM BICARBONATE 20-1100 MG PO CAPS: 2.0000 | ORAL_CAPSULE | Freq: Every day | ORAL | Status: DC

## 2010-11-04 MED ORDER — GABAPENTIN 300 MG PO CAPS: ORAL_CAPSULE | ORAL | Status: DC

## 2010-11-04 MED ORDER — METHYLPREDNISOLONE 8 MG PO TABS: ORAL_TABLET | ORAL | Status: DC

## 2010-11-04 NOTE — Progress Notes (Signed)
  Subjective:    Patient ID: Donald Evans, male    DOB: Mar 02, 1970, 40 y.o.   MRN: [id number]  HPI  PROB LIST 1. Moderate persistent asthma/RADS. S/p xolair Rx in 2010 and then denied by insurance  - baseline fev1 2.58L/70% 2. MZ phenotype 3. Allergies to spring, pollen 4. Chronic sinus drainage  - s/p eval by Dr Donald Evans 2011 5. 10 pack smoker - quit 2006  OV 11/02/10 Seeing hm after 14 months for above. Presents with significant other Donald Evans. 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 Following that, heavy snoring at night. ? Pauses at night. Epworth score is 8  However biggest concern is worsening of cough past 3 months. SEvere cough. Worst early in the mporning. Does not wake him up at night. Feels something is stuck in throat. C.o tickle in throat. Cough is loud and horrible per family. Barking quality. Associated gerd is more active than baseline; waking up 2 times a month due to gerd. RSI cores is 24 of 35 and cw LPR cough. There is mild hoarsness, mod-severe clering of throat, severe post nasal drip, mild-mod worsening after lying down, and severe sensation of lump in throat and annoyance from cough with mil-moderate heart burn. He recollects his ENT eval with Dr Donald Evans to be normal in 2011. He is unsure if cough is worse due to asthma worsening.   In terms of asthma:denies albuterol rescue use. No nocturnal awakening. No wheeze. Compliant with symbicort and singulair     Review of Systems  Constitutional: Negative for fever and unexpected weight change.  HENT: Positive for sneezing. Negative for ear pain, nosebleeds, congestion, sore throat, rhinorrhea, trouble swallowing, dental problem, postnasal drip and sinus pressure.   Eyes: Negative for redness and itching.  Respiratory: Positive for cough, chest tightness and shortness of breath. Negative for wheezing.   Cardiovascular: Negative for palpitations and leg swelling.    Gastrointestinal: Negative for nausea and vomiting.  Genitourinary: Negative for dysuria.  Musculoskeletal: Negative for joint swelling.  Skin: Negative for rash.  Neurological: Negative for headaches.  Hematological: Does not bruise/bleed easily.  Psychiatric/Behavioral: Negative for dysphoric mood. The patient is not nervous/anxious.        Objective:   Physical Exam Physical Exam  General: alert. NAD . NOW OBESE Head: no sinus tenderness  Eyes: pupils equal, pupils round, and no injection.  Ears: R ear normal and L ear normal.  Nose: no deformity, discharge, inflammation, or lesions  Mouth: no erythema and no exudates. MALLAMPATTI CLAS 3-4 Neck: supple and no cervical lymphadenopathy.  scar of mediastinascoppy +  Lungs: normal respiratory effort and normal breath sounds.  Heart: Normal rate and regular rhythm. S1 and S2 normal without gallop, murmur, click, rub or other extra sounds.  Abdomen: soft, non-tender, no hepatomegaly, and no splenomegaly.  Msk: no joint tenderness and no joint swelling.  Pulses: normal  Extremities: no edema  Neurologic: CN II-XII grossly intact with normal reflexes, coordination, muscle strength and tone  Skin: no rashes and no suspicious lesions.  Cervical Nodes: no significant adenopathy  Axillary Nodes: No palpable lymphadenopathy  Psych: alert and cooperative; normal mood and affect; normal attention span and concentration        Assessment & Plan:

## 2010-11-04 NOTE — Patient Instructions (Signed)
1) Cough is from sinus drainage, asthma, vocal cord dysfunction, acid reflux and all conspiring to cause cyclical cough/LPR cough #Sinus drainage  - START netti pot daily - continue flonase # Acid Reflux - this is not under control  - take otc zegerid 20 mg x  1 capsule daily on empty stomach   - take diet sheet from [place] - avoid colas, spices, cheeses, spirits, red meats, beer, chocolates, fried foods etc.,   - sleep with head end of bed elevated and eat small frequent meals  - do not go to bed for 3 hours after last meal  #Asthma  - your asthma appears worse  - continue symbicort 160/4.5 2 puff twice daily - start methylprednisolone 32mg  po daily x 3 days, then 16mg  po daily x 3 days, then 8mg  po daily x 3 days, then 4mg  daily x 3 days and stop  #Vocal Cord Dysfunction and Cyclical cough   - please choose 3 days and observe complete voice rest - no talking or whispering  - At any time there (not just the 3 days of voice rest but anytime) there is urge to cough, drink water or swallow or sip on throat lozenge  - start neurontin 300mg  po daily  X 5 days, then 300mg  po twice daily x 5days, then 300mg  po three times daily to continue. If this makes you sleepy, cut down on dose but you should continue neurontin at all times  2) WEight  - take [facility] Lipid Diet Sheet and follow it  3) Possible Sleep Apnea  - we will continue to monitor this - focus on weight loss  #Followup - I will see you in 5-6 weeks.  -spirometry and flu shot at followup

## 2010-11-06 DIAGNOSIS — E669 Obesity, unspecified: Secondary | ICD-10-CM | POA: Insufficient documentation

## 2010-11-06 DIAGNOSIS — R05 Cough: Secondary | ICD-10-CM | POA: Insufficient documentation

## 2010-11-06 DIAGNOSIS — J4520 Mild intermittent asthma, uncomplicated: Secondary | ICD-10-CM | POA: Insufficient documentation

## 2010-11-06 DIAGNOSIS — R0683 Snoring: Secondary | ICD-10-CM | POA: Insufficient documentation

## 2010-11-06 NOTE — Assessment & Plan Note (Signed)
[facility] lipid sheet and how to follow dietary advice instructed

## 2010-11-06 NOTE — Assessment & Plan Note (Signed)
Following RADS the best fev1 is around 70%. Donald Evans is lower, I will give him pred burst and repeat spiro at next visit. IF this is not improving will consider CT chest.

## 2010-11-06 NOTE — Assessment & Plan Note (Signed)
Obesse, recent weight gain, snores, epworth score 8. Diet and weight loss advised for now

## 2010-11-06 NOTE — Assessment & Plan Note (Addendum)
1) Cough is from sinus drainage, asthma, vocal cord dysfunction, acid reflux and all conspiring to cause cyclical cough/LPR cough #Sinus drainage  - START netti pot daily - continue flonase # Acid Reflux - this is not under control  - take otc zegerid 20 mg x  1 capsule daily on empty stomach   - take diet sheet from [place] - avoid colas, spices, cheeses, spirits, red meats, beer, chocolates, fried foods etc.,   - sleep with head end of bed elevated and eat small frequent meals  - do not go to bed for 3 hours after last meal  #Asthma  - your asthma appears worse  - continue symbicort 160/4.5 2 puff twice daily - start methylprednisolone 32mg  po daily x 3 days, then 16mg  po daily x 3 days, then 8mg  po daily x 3 days, then 4mg  daily x 3 days and stop  #Vocal Cord Dysfunction and Cyclical cough   - please choose 3 days and observe complete voice rest - no talking or whispering  - At any time there (not just the 3 days of voice rest but anytime) there is urge to cough, drink water or swallow or sip on throat lozenge  - start neurontin 300mg  po daily  X 5 days, then 300mg  po twice daily x 5days, then 300mg  po three times daily to continue. If this makes you sleepy, cut down on dose but you should continue neurontin at all times  - if withabove no improvement, consider CT to see if sarcoidosis is back  > 40 minutes visit. > 50% in counseling for all the issues

## 2010-11-07 ENCOUNTER — Other Ambulatory Visit: Payer: Self-pay | Admitting: *Deleted

## 2010-11-07 MED ORDER — ALBUTEROL SULFATE HFA 108 (90 BASE) MCG/ACT IN AERS: 2.0000 | INHALATION_SPRAY | Freq: Four times a day (QID) | RESPIRATORY_TRACT | Status: DC | PRN

## 2010-12-01 ENCOUNTER — Other Ambulatory Visit: Payer: Self-pay | Admitting: *Deleted

## 2010-12-01 MED ORDER — BUDESONIDE-FORMOTEROL FUMARATE 160-4.5 MCG/ACT IN AERO: 2.0000 | INHALATION_SPRAY | Freq: Two times a day (BID) | RESPIRATORY_TRACT | Status: DC

## 2010-12-16 ENCOUNTER — Ambulatory Visit: Payer: BC Managed Care – PPO | Admitting: Internal Medicine

## 2010-12-21 ENCOUNTER — Encounter: Payer: Self-pay | Admitting: Internal Medicine

## 2010-12-21 ENCOUNTER — Ambulatory Visit (INDEPENDENT_AMBULATORY_CARE_PROVIDER_SITE_OTHER): Payer: BC Managed Care – PPO | Admitting: Internal Medicine

## 2010-12-21 VITALS — BP 140/66 | HR 117 | Temp 98.8°F | Ht 64.0 in | Wt 210.6 lb

## 2010-12-21 DIAGNOSIS — J45909 Unspecified asthma, uncomplicated: Secondary | ICD-10-CM

## 2010-12-21 DIAGNOSIS — J4540 Moderate persistent asthma, uncomplicated: Secondary | ICD-10-CM

## 2010-12-21 DIAGNOSIS — R05 Cough: Secondary | ICD-10-CM

## 2010-12-21 MED ORDER — DOXYCYCLINE HYCLATE 100 MG PO TABS: 100.0000 mg | ORAL_TABLET | Freq: Two times a day (BID) | ORAL | Status: AC

## 2010-12-21 MED ORDER — GABAPENTIN 300 MG PO CAPS: ORAL_CAPSULE | ORAL | Status: DC

## 2010-12-21 MED ORDER — BUDESONIDE-FORMOTEROL FUMARATE 160-4.5 MCG/ACT IN AERO: 2.0000 | INHALATION_SPRAY | Freq: Two times a day (BID) | RESPIRATORY_TRACT | Status: DC

## 2010-12-21 MED ORDER — FLUTICASONE PROPIONATE 50 MCG/ACT NA SUSP: 1.0000 | Freq: Every day | NASAL | Status: DC

## 2010-12-21 MED ORDER — PANTOPRAZOLE SODIUM 40 MG PO TBEC: 40.0000 mg | DELAYED_RELEASE_TABLET | Freq: Every day | ORAL | Status: DC

## 2010-12-21 NOTE — Progress Notes (Signed)
Subjective:    Patient ID: Donald Evans, male    DOB: [birth year], 40 y.o.   MRN: [id number]  HPI PROB LIST 1. Moderate persistent asthma/RADS. S/p xolair Rx in 2010 and then denied by insurance  - baseline fev1 2.58L/70% 2. MZ phenotype 3. Allergies to spring, pollen 4. Chronic sinus drainage  - s/p eval by Dr [name] 2011 5. 10 pack smoker - quit 2006  OV 11/02/10 Seeing hm after 14 months for above. Presents with significant other [name]. 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 Following that, heavy snoring at night. ? Pauses at night. Epworth score is 8  However biggest concern is worsening of cough past 3 months. SEvere cough. Worst early in the mporning. Does not wake him up at night. Feels something is stuck in throat. C.o tickle in throat. Cough is loud and horrible per family. Barking quality. Associated gerd is more active than baseline; waking up 2 times a month due to gerd. RSI cores is 24 of 45 and cw LPR cough. There is mild hoarsness, mod-severe clering of throat, severe post nasal drip, mild-mod worsening after lying down, and severe sensation of lump in throat and annoyance from cough with mil-moderate heart burn. He recollects his ENT eval with Dr [name] to be normal in 2011. He is unsure if cough is worse due to asthma worsening.  In terms of asthma:denies albuterol rescue use. No nocturnal awakening. No wheeze. Compliant with symbicort and singulair  REC  - multimodal Rx for sinus, gerd, asthma with pred burst, cyclical cough with neurontin and silence  OV 12/21/10: Overall cough is unchanged since last visit. RSI score today is 26 and confirms that his cough is unchanged (clearing of throat, post nasal drop, and severe cough) We reviewed all the Rx measures I had advised. He and his significant other [name] noticed that not talking helped cough significantly for about 3 weeks but then cough recurred. Neurontin helped as well but  then he ran out.  Taking steroid burst for for asthma  helped cough as well but the methylprednisolone made him feelt weird (not edgy like on prednisone, not himself) . He is compliant with symbicort.  He is not doing netti pot regularly and I am not sure of how he is doing with flonase. In terms of GERD, he was compliant with diet but now slipping back. For PPI he is taking pantaprazole compliantly (not doing zegerid due to cost).   Of note, he seems to be having acute sinusitis developing  Past, Family, Social: no change  Review of Systems Cough + Snoring + No weight loss Rest of 11 point ROS - negative    Objective:   Physical Exam  Nursing note and vitals reviewed. Constitutional: He is oriented to person, place, and time. He appears well-developed and well-nourished. No distress.       overweight  HENT:  Head: Normocephalic and atraumatic.  Right Ear: External ear normal.  Left Ear: External ear normal.  Mouth/Throat: Oropharynx is clear and moist. No oropharyngeal exudate.  Eyes: Conjunctivae and EOM are normal. Pupils are equal, round, and reactive to light. Right eye exhibits no discharge. Left eye exhibits no discharge. No scleral icterus.  Neck: Normal range of motion. Neck supple. No JVD present. No tracheal deviation present. No thyromegaly present.  Cardiovascular: Normal rate, regular rhythm and intact distal pulses.  Exam reveals no gallop and no friction rub.   No murmur heard.  Pulmonary/Chest: Effort normal and breath sounds normal. No respiratory distress. He has no wheezes. He has no rales. He exhibits no tenderness.  Abdominal: Soft. Bowel sounds are normal. He exhibits no distension and no mass. There is no tenderness. There is no rebound and no guarding.  Musculoskeletal: Normal range of motion. He exhibits no edema and no tenderness.  Lymphadenopathy:    He has no cervical adenopathy.  Neurological: He is alert and oriented to person, place, and time. He has  normal reflexes. No cranial nerve deficit. Coordination normal.  Skin: Skin is warm and dry. No rash noted. He is not diaphoretic. No erythema. No pallor.  Psychiatric: He has a normal mood and affect. His behavior is normal. Judgment and thought content normal.          Assessment & Plan:

## 2010-12-21 NOTE — Patient Instructions (Addendum)
1) Cough is from sinus drainage, asthma, vocal cord dysfunction, acid reflux and all working together to cause cyclical cough/LPR cough. - REally important to follow directions  #Sinus drainage  - START netti pot do it atleast 3 times per week - continue flonase - Take doxycycline 100mg  po twice daily x 8 days; take after meals and avoid sunlight (you seem to be developing acute sinusitis)  # Acid Reflux - follow [facility] diet for lipids  -continue pantoprazole daily  on empty stomach   - take diet sheet from [place] - avoid colas, spices, cheeses, spirits, red meats, beer, chocolates, fried foods etc.,   - sleep with head end of bed elevated and eat small frequent meals  - do not go to bed for 3 hours after last meal  #Asthma  - - continue symbicort 160/4.5 2 puff twice daily - #Vocal Cord Dysfunction and Cyclical cough   - please choose 3 days and observe complete voice rest - no talking or whispering  - At any time there (not just the 3 days of voice rest but anytime) there is urge to cough, drink water or swallow or sip on throat lozenge  - REstart neurontin 300mg  po daily  X 5 days, then 300mg  po twice daily x 5days, then 300mg  po three times daily to continue. If this makes you sleepy, cut down on dose but you should continue neurontin at all times  2) WEight  - take [facility] Lipid Diet Sheet and follow it  3) Possible Sleep Apnea  - we will continue to monitor this - focus on weight loss  #Followup - I will see you in 5weeks.  -spirometry and flu shot at followup

## 2010-12-25 NOTE — Assessment & Plan Note (Signed)
Largely unchanged due to poor compliance with instructions. When he followed instruction he did better. He has difficulty maintaining compliance  PLAN 1) Cough is from sinus drainage, asthma, vocal cord dysfunction, acid reflux and all working together to cause cyclical cough/LPR cough. - REally important to follow directions  #Sinus drainage  - START netti pot do it atleast 3 times per week - continue flonase - Take doxycycline 100mg  po twice daily x 8 days; take after meals and avoid sunlight (you seem to be developing acute sinusitis)  # Acid Reflux - follow [facility] diet for lipids  -continue pantoprazole daily  on empty stomach   - take diet sheet from [place] - avoid colas, spices, cheeses, spirits, red meats, beer, chocolates, fried foods etc.,   - sleep with head end of bed elevated and eat small frequent meals  - do not go to bed for 3 hours after last meal  #Asthma  - - continue symbicort 160/4.5 2 puff twice daily - #Vocal Cord Dysfunction and Cyclical cough   - please choose 3 days and observe complete voice rest - no talking or whispering  - At any time there (not just the 3 days of voice rest but anytime) there is urge to cough, drink water or swallow or sip on throat lozenge  - REstart neurontin 300mg  po daily  X 5 days, then 300mg  po twice daily x 5days, then 300mg  po three times daily to continue. If this makes you sleepy, cut down on dose but you should continue neurontin at all times

## 2010-12-25 NOTE — Assessment & Plan Note (Signed)
Spirometry at fu

## 2011-01-10 ENCOUNTER — Other Ambulatory Visit: Payer: Self-pay | Admitting: *Deleted

## 2011-01-10 MED ORDER — DIAZEPAM 5 MG PO TABS: 5.0000 mg | ORAL_TABLET | Freq: Three times a day (TID) | ORAL | Status: DC | PRN

## 2011-01-10 NOTE — Telephone Encounter (Signed)
Pt says his las rx was suppose to be for a 90 day with refills but there was a mixup and it wasn't sent in for a year. He uses [facility]

## 2011-01-10 NOTE — Telephone Encounter (Signed)
Please let him know that I do not send 3 month rx's of controlled substances to local pharmacies and I never renew for a year. Our office policy is that 1 month at a time is all due to concerns about medication diversion and dependence potential He will need to renew on a monthly basis If he has concerns about this, I would rather not prescribe these meds at all and this is the rule here

## 2011-01-10 NOTE — Telephone Encounter (Signed)
Spoke with patient and advised results, rx called in for 90tablets, pt also scheduled CPX

## 2011-01-30 ENCOUNTER — Encounter: Payer: Self-pay | Admitting: Internal Medicine

## 2011-01-30 ENCOUNTER — Ambulatory Visit (INDEPENDENT_AMBULATORY_CARE_PROVIDER_SITE_OTHER): Payer: BC Managed Care – PPO | Admitting: Internal Medicine

## 2011-01-30 VITALS — BP 132/84 | HR 110 | Temp 98.8°F | Ht 65.0 in | Wt 209.6 lb

## 2011-01-30 VITALS — BP 142/93 | HR 110 | Temp 98.5°F | Ht 66.0 in | Wt 208.0 lb

## 2011-01-30 DIAGNOSIS — F411 Generalized anxiety disorder: Secondary | ICD-10-CM

## 2011-01-30 DIAGNOSIS — J4540 Moderate persistent asthma, uncomplicated: Secondary | ICD-10-CM

## 2011-01-30 DIAGNOSIS — K219 Gastro-esophageal reflux disease without esophagitis: Secondary | ICD-10-CM

## 2011-01-30 DIAGNOSIS — R7301 Impaired fasting glucose: Secondary | ICD-10-CM | POA: Insufficient documentation

## 2011-01-30 DIAGNOSIS — Z23 Encounter for immunization: Secondary | ICD-10-CM

## 2011-01-30 DIAGNOSIS — R05 Cough: Secondary | ICD-10-CM

## 2011-01-30 DIAGNOSIS — Z0000 Encounter for general adult medical examination without abnormal findings: Secondary | ICD-10-CM

## 2011-01-30 DIAGNOSIS — J309 Allergic rhinitis, unspecified: Secondary | ICD-10-CM

## 2011-01-30 DIAGNOSIS — J45909 Unspecified asthma, uncomplicated: Secondary | ICD-10-CM

## 2011-01-30 LAB — CBC WITH DIFFERENTIAL/PLATELET
Basophils Relative: 0.2 % (ref 0.0–3.0)
Eosinophils Absolute: 0.1 10*3/uL (ref 0.0–0.7)
MCHC: 34.8 g/dL (ref 30.0–36.0)
MCV: 93.9 fl (ref 78.0–100.0)
Monocytes Absolute: 0.8 10*3/uL (ref 0.1–1.0)
Neutrophils Relative %: 72.6 % (ref 43.0–77.0)
Platelets: 211 10*3/uL (ref 150.0–400.0)

## 2011-01-30 LAB — BASIC METABOLIC PANEL
BUN: 17 mg/dL (ref 6–23)
CO2: 25 mEq/L (ref 19–32)
Chloride: 103 mEq/L (ref 96–112)
Creatinine, Ser: 1.2 mg/dL (ref 0.4–1.5)

## 2011-01-30 LAB — TSH: TSH: 0.65 u[IU]/mL (ref 0.35–5.50)

## 2011-01-30 LAB — HEPATIC FUNCTION PANEL
ALT: 123 U/L — ABNORMAL HIGH (ref 0–53)
Albumin: 4.5 g/dL (ref 3.5–5.2)
Total Bilirubin: 0.9 mg/dL (ref 0.3–1.2)

## 2011-01-30 NOTE — Assessment & Plan Note (Signed)
Discussed using diazepam only prn--not regularly

## 2011-01-30 NOTE — Progress Notes (Signed)
Subjective:    Patient ID: Donald Evans, male    DOB: 07/29/1970, 40 y.o.   MRN: [id number]  HPI  PROB LIST 1. Moderate persistent asthma/RADS. S/p xolair Rx in 2010 and then denied by insurance  - baseline fev1 2.58L/70% 2. MZ phenotype 3. Allergies to spring, pollen 4. Chronic sinus drainage  - s/p eval by Dr [name] 2011 5. 10 pack smoker - quit 2006 6. Multifactorial cough - LPR type - sept 2012 - RSI score 24 - oct 2012 - RSI score 26 - dec 2012 - RSI socre 14 7. Snoring with ESS 8 - sept 2012  OV 11/02/10 Seeing hm after 14 months for above. Presents with significant other [name]. 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 Following that, heavy snoring at night. ? Pauses at night. Epworth score is 8  However biggest concern is worsening of cough past 3 months. SEvere cough. Worst early in the mporning. Does not wake him up at night. Feels something is stuck in throat. C.o tickle in throat. Cough is loud and horrible per family. Barking quality. Associated gerd is more active than baseline; waking up 2 times a month due to gerd. RSI cores is 24 of 45 and cw LPR cough. There is mild hoarsness, mod-severe clering of throat, severe post nasal drip, mild-mod worsening after lying down, and severe sensation of lump in throat and annoyance from cough with mil-moderate heart burn. He recollects his ENT eval with Dr [name] to be normal in 2011. He is unsure if cough is worse due to asthma worsening.  In terms of asthma:denies albuterol rescue use. No nocturnal awakening. No wheeze. Compliant with symbicort and singulair  REC  - multimodal Rx for sinus, gerd, asthma with pred burst, cyclical cough with neurontin and silence  OV 12/21/10: Overall cough is unchanged since last visit. RSI score today is 26 and confirms that his cough is unchanged (clearing of throat, post nasal drop, and severe cough) We reviewed all the Rx measures I had advised. He  and his significant other [name] noticed that not talking helped cough significantly for about 3 weeks but then cough recurred. Neurontin helped as well but then he ran out.  Taking steroid burst for for asthma  helped cough as well but the methylprednisolone made him feelt weird (not edgy like on prednisone, not himself) . He is compliant with symbicort.  He is not doing netti pot regularly and I am not sure of how he is doing with flonase. In terms of GERD, he was compliant with diet but now slipping back. For PPI he is taking pantaprazole compliantly (not doing zegerid due to cost).   Of note, he seems to be having acute sinusitis developing  Past, Family, Social: no change  REC #Sinus drainage  - START netti pot do it atleast 3 times per week - continue flonase - Take doxycycline 100mg  po twice daily x 8 days; take after meals and avoid sunlight (you seem to be developing acute sinusitis)  # Acid Reflux - follow [facility] diet for lipids  -continue pantoprazole daily  on empty stomach   - take diet sheet from [place] - avoid colas, spices, cheeses, spirits, red meats, beer, chocolates, fried foods etc.,   - sleep with head end of bed elevated and eat small frequent meals  - do not go to bed for 3 hours after last meal  #Asthma  - - continue symbicort 160/4.5 2  puff twice daily - #Vocal Cord Dysfunction and Cyclical cough   - please choose 3 days and observe complete voice rest - no talking or whispering  - At any time there (not just the 3 days of voice rest but anytime) there is urge to cough, drink water or swallow or sip on throat lozenge  - REstart neurontin 300mg  po daily  X 5 days, then 300mg  po twice daily x 5days, then 300mg  po three times daily to continue. If this makes you sleepy, cut down on dose but you should continue neurontin at all times  2) WEight  - take [facility] Lipid Diet Sheet and follow it  3) Possible Sleep Apnea  - we will continue to monitor this - focus on weight  loss  #Followup - I will see you in 5weeks.  -spirometry and flu shot at followu  OV 01/30/2011 Followup LPR cough. Following the multi-modal Rx approach per hx. Cough significantly better. This is reflected by 50% reduction in objective RSI cough score;  imprved to 14 - level 3 mucus in throat, annying cough, and lump in throat. Level 2 - clearing of throat and level 1 - hoarsness, choking episodes, and heartburn. He feels all Rx modality helps  Past, Social, Family reviewed: Waynetta Sandy is not present with him today. HE has joined [employer] and is making efforts in losing weight and working with trainer. No other new issues.    Review of Systems  Constitutional: Negative for fever, appetite change and unexpected weight change.  HENT: Positive for congestion and dental problem. Negative for ear pain, sore throat, rhinorrhea, sneezing, trouble swallowing, postnasal drip and sinus pressure.   Eyes: Negative for redness.  Respiratory: Positive for cough and shortness of breath. Negative for wheezing.   Cardiovascular: Positive for leg swelling. Negative for chest pain and palpitations.  Gastrointestinal: Positive for diarrhea. Negative for nausea, vomiting and abdominal pain.  Genitourinary: Negative for dysuria and urgency.  Musculoskeletal: Negative for joint swelling.  Skin: Negative for rash.  Neurological: Negative for syncope and headaches.  Hematological: Does not bruise/bleed easily.  Psychiatric/Behavioral: Negative for dysphoric mood. The patient is nervous/anxious.        Objective:   Physical Exam  Nursing note and vitals reviewed. Constitutional: He is oriented to person, place, and time. He appears well-developed and well-nourished. No distress.  HENT:  Head: Normocephalic and atraumatic.  Right Ear: External ear normal.  Left Ear: External ear normal.  Mouth/Throat: Oropharynx is clear and moist. No oropharyngeal exudate.  Eyes: Conjunctivae and EOM are normal. Pupils are  equal, round, and reactive to light. Right eye exhibits no discharge. Left eye exhibits no discharge. No scleral icterus.  Neck: Normal range of motion. Neck supple. No JVD present. No tracheal deviation present. No thyromegaly present.  Cardiovascular: Normal rate, regular rhythm and intact distal pulses.  Exam reveals no gallop and no friction rub.   No murmur heard. Pulmonary/Chest: Effort normal and breath sounds normal. No respiratory distress. He has no wheezes. He has no rales. He exhibits no tenderness.  Abdominal: Soft. Bowel sounds are normal. He exhibits no distension and no mass. There is no tenderness. There is no rebound and no guarding.  Musculoskeletal: Normal range of motion. He exhibits no edema and no tenderness.  Lymphadenopathy:    He has no cervical adenopathy.  Neurological: He is alert and oriented to person, place, and time. He has normal reflexes. No cranial nerve deficit. Coordination normal.  Skin: Skin is warm and  dry. No rash noted. He is not diaphoretic. No erythema. No pallor.  Psychiatric: He has a normal mood and affect. His behavior is normal. Judgment and thought content normal.          Assessment & Plan:

## 2011-01-30 NOTE — Patient Instructions (Addendum)
)   Cough is from sinus drainage, asthma, vocal cord dysfunction, acid reflux and all working together to cause cyclical cough/LPR cough. - REally important to follow directions - Glad you are better  #Sinus drainage  - Continue netti pot do it atleast 3 times per week - continue flonase -   # Acid Reflux - continue [facility] diet for lipids  -continue pantoprazole daily  on empty stomach   - take diet sheet from [place] - avoid colas, spices, cheeses, spirits, red meats, beer, chocolates, fried foods etc.,   - sleep with head end of bed elevated and eat small frequent meals  - do not go to bed for 3 hours after last meal  #Asthma  - - continue symbicort 160/4.5 2 puff twice daily - take pro -air before exercise - have flu shot today   #Vocal Cord Dysfunction and Cyclical cough   - if cough gets worse, please choose 3 days and observe complete voice rest - no talking or whispering  - At any time there (not just the 3 days of voice rest but anytime) there is urge to cough, drink water or swallow or sip on throat lozenge  - Continue  neurontin 300mg  po three times daily to continue. If this makes you sleepy, cut down on dose but you should continue neurontin at all times   3) Possible Sleep Apnea  - we will continue to monitor this - focus on weight loss  #Followup - I will see you in 8 weeks.

## 2011-01-30 NOTE — Assessment & Plan Note (Signed)
Dr Marchelle Gearing manages Related to chlorine exposure in past

## 2011-01-30 NOTE — Assessment & Plan Note (Signed)
Now starting to work on fitness Will check sugar, liver tests ,etc

## 2011-01-30 NOTE — Assessment & Plan Note (Signed)
Quiet on PPI 

## 2011-01-30 NOTE — Progress Notes (Signed)
Subjective:    Patient ID: Donald Evans, male    DOB: 1970/08/05, 40 y.o.   MRN: [id number]  HPI Just seen today by Pulmonary Repeat spirometry improved today Had started a exercise program with walking Has lost 12# since then Working with personal trainer at the [facility] also  No trouble with heartburn lately Still on the PPI Has started proper eating through the fitness program at the [facility]  Still uses the allergy meds On gabapentin for vocal cord dysfunction/cyclical cough  Uses the diazepam a day for anxiety Discussed making this more intemittent  Current Outpatient Prescriptions on File Prior to Visit  Medication Sig Dispense Refill  . albuterol (PROVENTIL HFA;VENTOLIN HFA) 108 (90 BASE) MCG/ACT inhaler Inhale 2 puffs into the lungs every 6 (six) hours as needed for wheezing or shortness of breath.  1 Inhaler  6  . budesonide-formoterol (SYMBICORT) 160-4.5 MCG/ACT inhaler Inhale 2 puffs into the lungs 2 (two) times daily.  1 Inhaler  5  . cetirizine (ZYRTEC) 10 MG tablet Take 10 mg by mouth daily. Taking as needed       . Cyanocobalamin (B-12 PO) Take by mouth daily.        . diazepam (VALIUM) 5 MG tablet Take 1 tablet (5 mg total) by mouth 3 (three) times daily as needed for anxiety.  90 tablet  0  . fluticasone (FLONASE) 50 MCG/ACT nasal spray Place 1 spray into the nose daily. 2 times daily  16 g  5  . Multiple Vitamin (MULTIVITAMIN) tablet Take 1 tablet by mouth daily.        . pantoprazole (PROTONIX) 40 MG tablet Take 1 tablet (40 mg total) by mouth daily.  30 tablet  3  . SINGULAIR 10 MG tablet daily.         No Known Allergies  Past Medical History  Diagnosis Date  . Allergy   . 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 GERD (gastroesophageal reflux disease)     No past surgical history on file.  No family history on file.  History   Social History  . Marital Status: Single    Spouse Name: N/A    Number of Children: 1    . Years of Education: N/A   Occupational History  . Works on wells/ septic    Social History Main Topics  . Smoking status: Former Smoker -- 1.0 packs/day for 10 years    Types: Cigarettes    Quit date: 11/03/2004  . Smokeless tobacco: Never Used  . Alcohol Use: Yes  . Drug Use: No  . Sexually Active: Not on file   Other Topics Concern  . Not on file   Social History Narrative  . No narrative on file   Review of Systems  Constitutional: Negative for fatigue and unexpected weight change.       Wears seat belt  HENT: Negative for hearing loss, congestion, rhinorrhea, dental problem and tinnitus.        Regular with dentist  Eyes: Negative for visual disturbance.       No diplopia or unilateral vision loss   Respiratory: Positive for cough. Negative for chest tightness and shortness of breath.        With regular cough still--esp in AM after shower. Some regular sticky mucus  Cardiovascular: Positive for chest pain. Negative for palpitations and leg swelling.       Occ gets chest pain with stress  No exertional problems  Gastrointestinal: Negative for nausea, vomiting, constipation and blood in stool.       Heartburn controlled  Genitourinary: Negative for dysuria, urgency, enuresis and difficulty urinating.       No sexual problems  Musculoskeletal: Negative for joint swelling, arthralgias and gait problem.  Skin: Negative for rash.       Has spots on hand and in left scalp  Neurological: Negative for dizziness, syncope, weakness, light-headedness, numbness and headaches.  Hematological: Negative for adenopathy. Does not bruise/bleed easily.  Psychiatric/Behavioral: Negative for sleep disturbance and dysphoric mood. The patient is nervous/anxious.        Stress with son--nothing too serious Still splits his time with ex       Objective:   Physical Exam  Constitutional: He is oriented to person, place, and time. He appears well-developed and well-nourished. No  distress.  HENT:  Head: Normocephalic and atraumatic.  Right Ear: External ear normal.  Left Ear: External ear normal.  Mouth/Throat: Oropharynx is clear and moist. No oropharyngeal exudate.       TMs normal  Eyes: Conjunctivae and EOM are normal. Pupils are equal, round, and reactive to light.       Fundi benign  Neck: Normal range of motion. Neck supple. No thyromegaly present.  Cardiovascular: Normal rate, regular rhythm, normal heart sounds and intact distal pulses.  Exam reveals no gallop.   No murmur heard. Pulmonary/Chest: Effort normal and breath sounds normal. No respiratory distress. He has no wheezes. He has no rales.  Abdominal: Soft. There is no tenderness.  Musculoskeletal: Normal range of motion. He exhibits no edema and no tenderness.  Lymphadenopathy:    He has no cervical adenopathy.  Neurological: He is alert and oriented to person, place, and time.  Skin: Skin is warm. No rash noted.       No worrisome lesions  Psychiatric: He has a normal mood and affect. His behavior is normal. Judgment and thought content normal.          Assessment & Plan:

## 2011-01-30 NOTE — Assessment & Plan Note (Signed)
Uses meds 

## 2011-01-31 ENCOUNTER — Encounter: Payer: Self-pay | Admitting: Internal Medicine

## 2011-01-31 NOTE — Assessment & Plan Note (Signed)
)   Cough is from sinus drainage, asthma, vocal cord dysfunction, acid reflux and all working together to cause cyclical cough/LPR cough. - REally important to follow directions - Glad you are better  #Sinus drainage  - Continue netti pot do it atleast 3 times per week - continue flonase -   # Acid Reflux - continue [facility] diet for lipids  -continue pantoprazole daily  on empty stomach   - take diet sheet from [place] - avoid colas, spices, cheeses, spirits, red meats, beer, chocolates, fried foods etc.,   - sleep with head end of bed elevated and eat small frequent meals  - do not go to bed for 3 hours after last meal  #Asthma  - - continue symbicort 160/4.5 2 puff twice daily - take pro -air before exercise - have flu shot today   #Vocal Cord Dysfunction and Cyclical cough   - if cough gets worse, please choose 3 days and observe complete voice rest - no talking or whispering  - At any time there (not just the 3 days of voice rest but anytime) there is urge to cough, drink water or swallow or sip on throat lozenge  - Continue  neurontin 300mg po three times daily to continue. If this makes you sleepy, cut down on dose but you should continue neurontin at all times   3) Possible Sleep Apnea  - we will continue to monitor this - focus on weight loss  #Followup - I will see you in 8 weeks.   

## 2011-02-07 ENCOUNTER — Telehealth: Payer: Self-pay | Admitting: Internal Medicine

## 2011-02-07 DIAGNOSIS — J309 Allergic rhinitis, unspecified: Secondary | ICD-10-CM

## 2011-02-07 MED ORDER — CEFDINIR 300 MG PO CAPS: 300.0000 mg | ORAL_CAPSULE | Freq: Two times a day (BID) | ORAL | Status: AC

## 2011-02-07 NOTE — Telephone Encounter (Signed)
I spoke with pt and he c/o chest congestion, cough w/ brown phlem, nasal congestion, sinus pressure , PND, runny nose x 3 days. Pt denies any nausea, vomiting, vomiting. Pt states he did do the cyclical cough protocol and it did help with his cough and is not cough as bad just bring phlem up. Pt is using the nettie pot 3 times a week and using flonase BID. Pt is requesting something be called in for him. Pt was seen 01/30/11. Please advise Dr. Marchelle Gearing, thanks  .allrgy

## 2011-02-07 NOTE — Telephone Encounter (Signed)
Sounds like acute sinusitis.  This time he should try Omnicef of Cefdinir 300mg  twice daily for 7 days.  Please please refer him to Dr. [name] his ENT doc from 2011  If worse, call again for prednisone or go to ER

## 2011-02-07 NOTE — Telephone Encounter (Signed)
Pt is aware of MR recs. Rx has been sent to pharmacy.

## 2011-02-08 ENCOUNTER — Other Ambulatory Visit: Payer: Self-pay | Admitting: *Deleted

## 2011-02-08 NOTE — Telephone Encounter (Signed)
Last refill 01/10/11

## 2011-02-09 MED ORDER — DIAZEPAM 5 MG PO TABS: 5.0000 mg | ORAL_TABLET | Freq: Three times a day (TID) | ORAL | Status: DC | PRN

## 2011-02-09 NOTE — Telephone Encounter (Signed)
Okay #90 x 0  Remind her per our discussion at his physical, that I expect this number to last way more than 1 month!

## 2011-02-09 NOTE — Telephone Encounter (Signed)
rx called into pharmacy Spoke with patient and advised results, he will take only 1/2

## 2011-03-07 ENCOUNTER — Encounter: Payer: Self-pay | Admitting: Emergency Medicine

## 2011-03-07 ENCOUNTER — Telehealth: Payer: Self-pay | Admitting: Internal Medicine

## 2011-03-07 ENCOUNTER — Ambulatory Visit: Payer: BC Managed Care – PPO | Admitting: Emergency Medicine

## 2011-03-07 ENCOUNTER — Ambulatory Visit (INDEPENDENT_AMBULATORY_CARE_PROVIDER_SITE_OTHER): Payer: BC Managed Care – PPO | Admitting: Emergency Medicine

## 2011-03-07 VITALS — BP 122/80 | HR 108 | Temp 98.7°F | Ht 66.0 in | Wt 208.6 lb

## 2011-03-07 DIAGNOSIS — R059 Cough, unspecified: Secondary | ICD-10-CM

## 2011-03-07 DIAGNOSIS — R05 Cough: Secondary | ICD-10-CM

## 2011-03-07 MED ORDER — HYDROCOD POLST-CHLORPHEN POLST 10-8 MG/5ML PO LQCR: 5.0000 mL | Freq: Two times a day (BID) | ORAL | Status: DC | PRN

## 2011-03-07 NOTE — Patient Instructions (Signed)
Please continue all your current medications Use Tussionex 5cc every 12 hours if needed for cough

## 2011-03-07 NOTE — Assessment & Plan Note (Signed)
He is on all the right meds to modify chronic cough, will try to treat with cough suppressant to see if we can quiet this. He is to see Dr [name] this week.

## 2011-03-07 NOTE — Progress Notes (Signed)
Subjective:    Patient ID: Donald Evans, male    DOB: 1971-01-14, 41 y.o.   MRN: [id number]  HPI PROB LIST  1. Moderate persistent asthma/RADS. S/p xolair Rx in 2010 and then denied by insurance  - baseline fev1 2.58L/70%  2. MZ phenotype  3. Allergies to spring, pollen  4. Chronic sinus drainage  - s/p eval by Dr [name] 2011  5. 10 pack smoker - quit 2006  6. Multifactorial cough - LPR type  - sept 2012 - RSI score 24  - oct 2012 - RSI score 26  - dec 2012 - RSI socre 14  7. Snoring with ESS 8 - sept 2012  OV 11/02/10  Seeing hm after 14 months for above. Presents with significant other [name]. 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 Following that, heavy snoring at night. ? Pauses at night. Epworth score is 8  However biggest concern is worsening of cough past 3 months. SEvere cough. Worst early in the mporning. Does not wake him up at night. Feels something is stuck in throat. C.o tickle in throat. Cough is loud and horrible per family. Barking quality. Associated gerd is more active than baseline; waking up 2 times a month due to gerd. RSI cores is 24 of 45 and cw LPR cough. There is mild hoarsness, mod-severe clering of throat, severe post nasal drip, mild-mod worsening after lying down, and severe sensation of lump in throat and annoyance from cough with mil-moderate heart burn. He recollects his ENT eval with Dr [name] to be normal in 2011. He is unsure if cough is worse due to asthma worsening. In terms of asthma:denies albuterol rescue use. No nocturnal awakening. No wheeze. Compliant with symbicort and singulair  REC  - multimodal Rx for sinus, gerd, asthma with pred burst, cyclical cough with neurontin and silence  OV 12/21/10: Overall cough is unchanged since last visit. RSI score today is 26 and confirms that his cough is unchanged (clearing of throat, post nasal drop, and severe cough) We reviewed all the Rx measures I had advised.  He and his significant other [name] noticed that not talking helped cough significantly for about 3 weeks but then cough recurred. Neurontin helped as well but then he ran out. Taking steroid burst for for asthma helped cough as well but the methylprednisolone made him feelt weird (not edgy like on prednisone, not himself) . He is compliant with symbicort. He is not doing netti pot regularly and I am not sure of how he is doing with flonase. In terms of GERD, he was compliant with diet but now slipping back. For PPI he is taking pantaprazole compliantly (not doing zegerid due to cost).   Acute visit 03/07/11 -- Hx cough, asthma, allergies. Had been doing well, may have developed URI last week, was treated with abx for possible bronchitis. Cough improved. Has had recurrent dry cough over the last 3 - 4 days. Having some increased dyspnea, no wheeze. Has had more nasal drainage. Doing [place], nasal steroid, zyrtec, singulair, PPI. Planning to see [name] on Friday.    Review of Systems  Constitutional: Negative.  Negative for fever and unexpected weight change.  HENT: Negative.  Negative for ear pain, nosebleeds, congestion, sore throat, rhinorrhea, sneezing, trouble swallowing, dental problem, postnasal drip and sinus pressure.   Eyes: Negative.  Negative for redness and itching.  Respiratory: Positive for cough. Negative for chest tightness, shortness of breath and wheezing.  Cardiovascular: Negative.  Negative for palpitations and leg swelling.  Gastrointestinal: Negative.  Negative for nausea and vomiting.  Genitourinary: Negative.  Negative for dysuria.  Musculoskeletal: Negative.  Negative for joint swelling.  Skin: Negative.  Negative for rash.  Neurological: Negative.  Negative for headaches.  Hematological: Negative.  Does not bruise/bleed easily.  Psychiatric/Behavioral: Negative.  Negative for dysphoric mood. The patient is not nervous/anxious.        Objective:   Physical Exam  Gen:  Pleasant, well-nourished, in no distress,  normal affect  ENT: No lesions,  mouth clear,  oropharynx clear, no postnasal drip  Neck: No JVD, no TMG, no carotid bruits  Lungs: No use of accessory muscles, no dullness to percussion, clear without rales or rhonchi  Cardiovascular: RRR, heart sounds normal, no murmur or gallops, no peripheral edema  Musculoskeletal: No deformities, no cyanosis or clubbing  Neuro: alert, non focal  Skin: Warm, no lesions or rashes     Assessment & Plan:  Chronic cough He is on all the right meds to modify chronic cough, will try to treat with cough suppressant to see if we can quiet this. He is to see Dr [name] this week.

## 2011-03-07 NOTE — Telephone Encounter (Signed)
I spoke with pt and he states his cough is worse. Pt is scheduled to come in and see RB tomorrow at 2:00 fro evaluation. Pt aware if he worsens then to seek emergency care. Nothing further was needed

## 2011-03-08 ENCOUNTER — Ambulatory Visit: Payer: BC Managed Care – PPO | Admitting: Emergency Medicine

## 2011-04-04 ENCOUNTER — Ambulatory Visit: Payer: BC Managed Care – PPO | Admitting: Internal Medicine

## 2011-04-10 ENCOUNTER — Other Ambulatory Visit: Payer: Self-pay

## 2011-04-10 ENCOUNTER — Telehealth: Payer: Self-pay | Admitting: Internal Medicine

## 2011-04-10 MED ORDER — MONTELUKAST SODIUM 10 MG PO TABS: 10.0000 mg | ORAL_TABLET | Freq: Every day | ORAL | Status: DC

## 2011-04-10 MED ORDER — PANTOPRAZOLE SODIUM 40 MG PO TBEC: 40.0000 mg | DELAYED_RELEASE_TABLET | Freq: Every day | ORAL | Status: DC

## 2011-04-10 NOTE — Telephone Encounter (Signed)
I spoke with spouse and she stated pt needed a refill on his singulair. I advised will send rx and nothing further was needed

## 2011-04-10 NOTE — Telephone Encounter (Signed)
Patient's wife called and requested a refill. He is out of the singulair.  Needs it to be filled today if possible.

## 2011-04-11 ENCOUNTER — Encounter: Payer: Self-pay | Admitting: Internal Medicine

## 2011-04-11 ENCOUNTER — Ambulatory Visit (INDEPENDENT_AMBULATORY_CARE_PROVIDER_SITE_OTHER): Payer: BC Managed Care – PPO | Admitting: Internal Medicine

## 2011-04-11 VITALS — BP 126/80 | HR 102 | Temp 98.5°F | Ht 66.0 in | Wt 213.8 lb

## 2011-04-11 DIAGNOSIS — R05 Cough: Secondary | ICD-10-CM

## 2011-04-11 DIAGNOSIS — F411 Generalized anxiety disorder: Secondary | ICD-10-CM

## 2011-04-11 DIAGNOSIS — R21 Rash and other nonspecific skin eruption: Secondary | ICD-10-CM

## 2011-04-11 MED ORDER — DOXYCYCLINE HYCLATE 100 MG PO TABS: ORAL_TABLET | ORAL | Status: DC

## 2011-04-11 NOTE — Patient Instructions (Addendum)
1) Cough is from sinus drainage, asthma, vocal cord dysfunction, acid reflux and all working together to cause cyclical cough/LPR cough. I am not sure why you are not better #Sinus drainage  - Continue netti pot do it atleast 3 times per week - continue flonase - will refer you to [facility] due to insurance issues with Dr [name] office; wil ensure this is not the case with [facility] \- take Take doxycycline 100mg  po twice daily x 5 days; take after meals and avoid sunlight  # Acid Reflux - continue [facility] diet for lipids  -continue pantoprazole daily  on empty stomach   - take diet sheet from [place] - avoid colas, spices, cheeses, spirits, red meats, beer, chocolates, fried foods etc.,   - sleep with head end of bed elevated and eat small frequent meals  - do not go to bed for 3 hours after last meal #Asthma  - - continue symbicort 160/4.5 2 puff twice daily - take pro -air before exercise #Vocal Cord Dysfunction and Cyclical cough   -stop neurontin due to possible side effect of agitation and feeling lethargic  - do not take tussionex in day time and try to  Use is very spraingly  2) Agitation, chest tightness  - do not know what this is  - come off diazepam as advised by PMD  - stop neurontin  - will reassess at followup   3) Possible Sleep Apnea  - we will continue to monitor this - focus on weight loss  4) Rash on face for 1 month  - will keep an eye on this  - if persists, wil test for SLE  #Followup - I will see you in 4-5 weeks. - Cough score and spirometry at followu

## 2011-04-11 NOTE — Progress Notes (Signed)
Subjective:    Patient ID: Donald Evans, male    DOB: Dec 24, 1970, 41 y.o.   MRN: [id number]  HPI PROB LIST  1. Moderate persistent asthma/RADS. S/p xolair Rx in 2010 and then denied by insurance  - baseline fev1 2.58L/70%  - spirometry 04/11/2011: fev1 2.2L/57%, Ratio 61  2. MZ phenotype  3. Allergies to spring, pollen  4. Chronic sinus drainage  - s/p eval by Dr [name] 2011  5. 10 pack smoker - quit 2006  6. Multifactorial cough - LPR type  - sept 2012 - RSI score 24  - oct 2012 - RSI score 26  - dec 2012 - RSI socre 14  - feb 2012 - RSI cough score 22 7. Snoring with ESS 8 - sept 2012   OV 11/02/10  Seeing hm after 14 months for above. Presents with significant other [name]. 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 Following that, heavy snoring at night. ? Pauses at night. Epworth score is 8  However biggest concern is worsening of cough past 3 months. SEvere cough. Worst early in the mporning. Does not wake him up at night. Feels something is stuck in throat. C.o tickle in throat. Cough is loud and horrible per family. Barking quality. Associated gerd is more active than baseline; waking up 2 times a month due to gerd. RSI cores is 24 of 45 and cw LPR cough. There is mild hoarsness, mod-severe clering of throat, severe post nasal drip, mild-mod worsening after lying down, and severe sensation of lump in throat and annoyance from cough with mil-moderate heart burn. He recollects his ENT eval with Dr [name] to be normal in 2011. He is unsure if cough is worse due to asthma worsening. In terms of asthma:denies albuterol rescue use. No nocturnal awakening. No wheeze. Compliant with symbicort and singulair  REC  - multimodal Rx for sinus, gerd, asthma with pred burst, cyclical cough with neurontin and silence   OV 12/21/10: Overall cough is unchanged since last visit. RSI score today is 26 and confirms that his cough is unchanged (clearing of  throat, post nasal drop, and severe cough) We reviewed all the Rx measures I had advised. He and his significant other [name] noticed that not talking helped cough significantly for about 3 weeks but then cough recurred. Neurontin helped as well but then he ran out. Taking steroid burst for for asthma helped cough as well but the methylprednisolone made him feelt weird (not edgy like on prednisone, not himself) . He is compliant with symbicort. He is not doing netti pot regularly and I am not sure of how he is doing with flonase. In terms of GERD, he was compliant with diet but now slipping back. For PPI he is taking pantaprazole compliantly (not doing zegerid due to cost).   REC - multimodal Rx for sinus, gerd, asthma, cyclical cough with neurontin and repeat silence   Acute visit 03/07/11 (Dr Delton Coombes)  -- Hx cough, asthma, allergies. Had been doing well, may have developed URI last week, was treated with abx for possible bronchitis. Cough improved. Has had recurrent dry cough over the last 3 - 4 days. Having some increased dyspnea, no wheeze. Has had more nasal drainage. Doing NSW, nasal steroid, zyrtec, singulair, PPI. Planning to see Dr  [name] again on Friday.   REC Please continue all your current medications  Use Tussionex 5cc every 12 hours if needed for cough  OV 04/11/2011 Followup  multifactorial cough. After last OV saw Dr Delton Coombes acutely and Tussionex added. States that tussionex making him sleepy so he is taking it only prn at night. Does not take it at night.  Not seen ENT Dr [name] i interim because of insurance issues.  RSI cough score is 22 on 04/11/2011 (Level 5 - xs mucus drainage, and annoying cough, Level 4- sensation of lump in throat, heartburn, Level 3 - clearing throat, and level 1 - hoarseness) and worse than 14 in dec 2012. Spirometry 04/11/2011: fev1 2.2L/57%, Ratio 61 and worse than baseline. He is not giving a good history. I am not sure how compliant he is with instructions; he is saying  he is. STill with same complaints of cough, clearing throat and sinus drainage early in the morning. In addition, he reports that he was mistakenly taking benzo daily instead of prn for panic attacks diagnosed by PMD several years ago and so he is slowly weanning it off now and since wean process started several weeks ago is having random episodes of agitation and shakiness and chest tightness (denies chest pain, or sense of doom or associated diaphoresis, or claustrophobia). He also has new malar rash on face with acneform eruptions  Past, Family, Social reviewed: no change since last visit other than that documented in HPI. Significant other [name] illwith viral        Review of Systems  Constitutional: Positive for fever. Negative for unexpected weight change.  HENT: Positive for ear pain, congestion, sore throat, rhinorrhea, dental problem, postnasal drip and sinus pressure. Negative for nosebleeds, sneezing and trouble swallowing.   Eyes: Negative for redness and itching.  Respiratory: Positive for cough and shortness of breath. Negative for chest tightness and wheezing.   Cardiovascular: Negative for palpitations and leg swelling.  Gastrointestinal: Positive for vomiting. Negative for nausea.  Genitourinary: Negative for dysuria.  Musculoskeletal: Negative for joint swelling.  Skin: Negative for rash.  Neurological: Negative for headaches.  Hematological: Does not bruise/bleed easily.  Psychiatric/Behavioral: Positive for dysphoric mood. The patient is nervous/anxious.        Objective:   Physical Exam Gen: Pleasant, well-nourished, in no distress,  normal affect  ENT: No lesions,  mouth clear,  oropharynx clear, no postnasal drip  Neck: No JVD, no TMG, no carotid bruits  Lungs: No use of accessory muscles, no dullness to percussion, clear without rales or rhonchi  Cardiovascular: RRR, heart sounds normal, no murmur or gallops, no peripheral edema  Musculoskeletal: No  deformities, no cyanosis or clubbing  Neuro: alert, non focal  Skin: Warm, no lesions or rashes except malar rash on face with acenform eruptions       Assessment & Plan:

## 2011-04-12 ENCOUNTER — Encounter: Payer: Self-pay | Admitting: Internal Medicine

## 2011-04-12 DIAGNOSIS — R21 Rash and other nonspecific skin eruption: Secondary | ICD-10-CM | POA: Insufficient documentation

## 2011-04-12 NOTE — Assessment & Plan Note (Signed)
1) Cough is from sinus drainage, asthma, vocal cord dysfunction, acid reflux and all working together to cause cyclical cough/LPR cough. I am not sure why you are not better #Sinus drainage  - Continue netti pot do it atleast 3 times per week - continue flonase - will refer [facility] to [facility] due to insurance issues with Dr [name] office; wil ensure this is not the case with [facility]NT \- take Take doxycycline 100mg  po twice daily x 5 days; take after meals and avoid sunlight  # Acid Reflux - continue [facility] diet for lipids  -continue pantoprazole daily  on empty stomach   - take diet sheet from [place] - avoid colas, spices, cheeses, spirits, red meats, beer, chocolates, fried foods etc.,   - sleep with head end of bed elevated and eat small frequent meals  - do not go to bed for 3 hours after last meal #Asthma  - - continue symbicort 160/4.5 2 puff twice daily - take pro -air before exercise #Vocal Cord Dysfunction and Cyclical cough   -stop neurontin due to possible side effect of agitation and feeling lethargic  - do not take tussionex in day time and try to  Use is very spraingly  2) Agitation, chest tightness  - do not know what this is  - come off diazepam as advised by PMD  - stop neurontin  - will reassess at followup   3) Possible Sleep Apnea  - we will continue to monitor this - focus on weight loss   #Followup - I will see you in 4-5 weeks. - Cough score and spirometry at followu

## 2011-04-12 NOTE — Assessment & Plan Note (Signed)
Dc neurontin Wean off benzo as advised by pmd monitor

## 2011-04-12 NOTE — Assessment & Plan Note (Signed)
Rash on face for 1 month  - will keep an eye on this  - if persists, wil test for SLE or/and sarcoid (he has hx of sarcoid)

## 2011-05-09 ENCOUNTER — Ambulatory Visit: Payer: BC Managed Care – PPO | Admitting: Pulmonary Disease

## 2011-07-21 ENCOUNTER — Other Ambulatory Visit: Payer: Self-pay | Admitting: *Deleted

## 2011-07-21 MED ORDER — DIAZEPAM 5 MG PO TABS: 5.0000 mg | ORAL_TABLET | Freq: Three times a day (TID) | ORAL | Status: DC | PRN

## 2011-07-21 NOTE — Telephone Encounter (Signed)
Okay #90 x 0 Due for physical in December or January---may want to schedule soon

## 2011-07-21 NOTE — Telephone Encounter (Signed)
rx called into pharmacy Spoke with patient and advised results   

## 2011-07-21 NOTE — Telephone Encounter (Signed)
Faxed refill request from [facility], last filled 90 on 04/10/11.

## 2011-08-23 ENCOUNTER — Other Ambulatory Visit: Payer: Self-pay | Admitting: Internal Medicine

## 2011-08-23 MED ORDER — MONTELUKAST SODIUM 10 MG PO TABS: 10.0000 mg | ORAL_TABLET | Freq: Every day | ORAL | Status: DC

## 2011-10-25 ENCOUNTER — Ambulatory Visit (INDEPENDENT_AMBULATORY_CARE_PROVIDER_SITE_OTHER): Payer: BC Managed Care – PPO | Admitting: Internal Medicine

## 2011-10-25 ENCOUNTER — Encounter: Payer: Self-pay | Admitting: Internal Medicine

## 2011-10-25 VITALS — BP 144/98 | HR 104 | Temp 99.0°F | Wt 209.5 lb

## 2011-10-25 DIAGNOSIS — J020 Streptococcal pharyngitis: Secondary | ICD-10-CM | POA: Insufficient documentation

## 2011-10-25 DIAGNOSIS — R030 Elevated blood-pressure reading, without diagnosis of hypertension: Secondary | ICD-10-CM | POA: Insufficient documentation

## 2011-10-25 NOTE — Assessment & Plan Note (Signed)
BP Readings from Last 3 Encounters:  10/25/11 144/98  04/11/11 126/80  03/07/11 122/80   Was up to 168/88 when I rechecked on right Sedentary, weight is up Discussed lifestyle issues No meds for now

## 2011-10-25 NOTE — Assessment & Plan Note (Signed)
Clinically improving on the amoxicillin

## 2011-10-25 NOTE — Progress Notes (Signed)
Subjective:    Patient ID: Donald Evans, male    DOB: 1970-10-19, 41 y.o.   MRN: [id number]  HPI Started feeling bad last week Awoke 5 days ago with sore throat and then couldn't swallow Went to urgent care 2 days ago  Son diagnosed with strep via culture  He was put on amoxicillin after positive rapid test Throat is still a little sore in the morning--then fine after that Fever at first---better now  No sig cough--AM cough to clear mucus persists Breathing has been okay  170/104 BP there Told to come back here for recheck  Current Outpatient Prescriptions on File Prior to Visit  Medication Sig Dispense Refill  . budesonide-formoterol (SYMBICORT) 160-4.5 MCG/ACT inhaler Inhale 2 puffs into the lungs 2 (two) times daily.  1 Inhaler  5  . cetirizine (ZYRTEC) 10 MG tablet Take 10 mg by mouth daily. Taking as needed       . diazepam (VALIUM) 5 MG tablet Take 1 tablet (5 mg total) by mouth 3 (three) times daily as needed for anxiety.  90 tablet  0  . fluticasone (FLONASE) 50 MCG/ACT nasal spray Place 1 spray into the nose daily. 2 times daily  16 g  5  . montelukast (SINGULAIR) 10 MG tablet Take 1 tablet (10 mg total) by mouth daily.  30 tablet  6  . Multiple Vitamin (MULTIVITAMIN) tablet Take 1 tablet by mouth daily.        . pantoprazole (PROTONIX) 40 MG tablet Take 1 tablet (40 mg total) by mouth daily.  30 tablet  3  . chlorpheniramine-HYDROcodone (TUSSIONEX PENNKINETIC ER) 10-8 MG/5ML LQCR Take 5 mLs by mouth every 12 (twelve) hours as needed (cough).  140 mL  0  . Cyanocobalamin (B-12 PO) Take by mouth daily.        Marland Kitchen doxycycline (VIBRA-TABS) 100 MG tablet Take 1 tablet two times daily for 5 days; take after meals and avoid sunlight.  10 tablet  0  . gabapentin (NEURONTIN) 300 MG capsule Take 900 mg by mouth daily.        Marland Kitchen DISCONTD: gabapentin (NEURONTIN) 300 MG capsule Take 1 tablet x 5 days, then 1 tablet twice daily x 5 days, then 1 tab three times a day and continue.  90  capsule  5  . DISCONTD: gabapentin (NEURONTIN) 300 MG capsule Take 1 tablet x 5 days, then 1 tablet twice daily x 5 days, then 1 tab three times a day and continue.  90 capsule  5    No Known Allergies  Past Medical History  Diagnosis Date  . Allergy   . Sarcoidosis      Stage 1 sarcoidosis - at age 73.  Spontaneous remission  . GERD (gastroesophageal reflux disease)     No past surgical history on file.  No family history on file.  History   Social History  . Marital Status: Single    Spouse Name: N/A    Number of Children: 1  . Years of Education: N/A   Occupational History  . Works on wells/ septic    Social History Main Topics  . Smoking status: Former Smoker -- 1.0 packs/day for 10 years    Types: Cigarettes    Quit date: 11/03/2004  . Smokeless tobacco: Never Used  . Alcohol Use: Yes     Occasional to regular  . Drug Use: No  . Sexually Active: Not on file   Other Topics Concern  . Not on file  Social History Narrative  . No narrative on file    Review of Systems Stress with job Long hours and driving a lot---not happy anymore Gaining weight due to poor eating---plans to go back on juicing diet    Objective:   Physical Exam  Constitutional: He appears well-developed and well-nourished. No distress.  HENT:       Still with some pharyngeal injection and on palate  Neck: Normal range of motion.  Pulmonary/Chest: Effort normal and breath sounds normal. No respiratory distress. He has no wheezes. He has no rales.  Lymphadenopathy:    He has cervical adenopathy.          Assessment & Plan:

## 2011-11-10 ENCOUNTER — Other Ambulatory Visit: Payer: Self-pay | Admitting: *Deleted

## 2011-11-10 MED ORDER — ALBUTEROL SULFATE HFA 108 (90 BASE) MCG/ACT IN AERS: 2.0000 | INHALATION_SPRAY | RESPIRATORY_TRACT | Status: DC | PRN

## 2011-11-29 ENCOUNTER — Other Ambulatory Visit: Payer: Self-pay | Admitting: *Deleted

## 2011-11-29 MED ORDER — DIAZEPAM 5 MG PO TABS: 5.0000 mg | ORAL_TABLET | Freq: Three times a day (TID) | ORAL | Status: DC | PRN

## 2011-11-29 NOTE — Telephone Encounter (Signed)
rx called into pharmacy

## 2011-11-29 NOTE — Telephone Encounter (Signed)
Last filled 07/21/11

## 2011-11-29 NOTE — Telephone Encounter (Signed)
Okay #90 x 0 

## 2012-01-05 ENCOUNTER — Telehealth: Payer: Self-pay | Admitting: Internal Medicine

## 2012-01-05 MED ORDER — BUDESONIDE-FORMOTEROL FUMARATE 160-4.5 MCG/ACT IN AERO: 2.0000 | INHALATION_SPRAY | Freq: Two times a day (BID) | RESPIRATORY_TRACT | Status: DC

## 2012-01-05 MED ORDER — PANTOPRAZOLE SODIUM 40 MG PO TBEC: 40.0000 mg | DELAYED_RELEASE_TABLET | Freq: Every day | ORAL | Status: DC

## 2012-01-05 MED ORDER — FLUTICASONE PROPIONATE 50 MCG/ACT NA SUSP: 1.0000 | Freq: Every day | NASAL | Status: DC

## 2012-01-05 NOTE — Telephone Encounter (Signed)
pts wife called and stated that the pt will be out of meds early next week.  meds have been sent to the pharmacy for refills and the pt has appt with MR on 12-16 for overdue follow up. Nothing further is needed.

## 2012-01-05 NOTE — Telephone Encounter (Signed)
Last OV 04/11/11 told to f/u in 4-5 weeks. No appt pending. symbicort last refilled: 12/21/10 #1 x 5 refills pantoprazole is refilled by Dr. Buena Irish refilled 12/21/10 #1 x 5 refills   lmomtcb x1 for pat

## 2012-01-30 ENCOUNTER — Ambulatory Visit (INDEPENDENT_AMBULATORY_CARE_PROVIDER_SITE_OTHER): Payer: BC Managed Care – PPO | Admitting: Internal Medicine

## 2012-01-30 ENCOUNTER — Encounter: Payer: Self-pay | Admitting: Internal Medicine

## 2012-01-30 VITALS — BP 138/80 | HR 110 | Temp 98.0°F | Ht 66.0 in | Wt 211.0 lb

## 2012-01-30 DIAGNOSIS — K219 Gastro-esophageal reflux disease without esophagitis: Secondary | ICD-10-CM

## 2012-01-30 DIAGNOSIS — J4540 Moderate persistent asthma, uncomplicated: Secondary | ICD-10-CM

## 2012-01-30 DIAGNOSIS — J45909 Unspecified asthma, uncomplicated: Secondary | ICD-10-CM

## 2012-01-30 DIAGNOSIS — J019 Acute sinusitis, unspecified: Secondary | ICD-10-CM

## 2012-01-30 DIAGNOSIS — Z0000 Encounter for general adult medical examination without abnormal findings: Secondary | ICD-10-CM

## 2012-01-30 DIAGNOSIS — E669 Obesity, unspecified: Secondary | ICD-10-CM

## 2012-01-30 DIAGNOSIS — R7301 Impaired fasting glucose: Secondary | ICD-10-CM

## 2012-01-30 LAB — HEPATIC FUNCTION PANEL
ALT: 106 U/L — ABNORMAL HIGH (ref 0–53)
AST: 73 U/L — ABNORMAL HIGH (ref 0–37)
Albumin: 4.2 g/dL (ref 3.5–5.2)
Alkaline Phosphatase: 66 U/L (ref 39–117)
Total Protein: 7.3 g/dL (ref 6.0–8.3)

## 2012-01-30 LAB — CBC WITH DIFFERENTIAL/PLATELET
Eosinophils Relative: 0.8 % (ref 0.0–5.0)
HCT: 44.7 % (ref 39.0–52.0)
Hemoglobin: 14.7 g/dL (ref 13.0–17.0)
Lymphs Abs: 1.2 10*3/uL (ref 0.7–4.0)
MCV: 94.8 fl (ref 78.0–100.0)
Monocytes Absolute: 0.7 10*3/uL (ref 0.1–1.0)
Monocytes Relative: 9 % (ref 3.0–12.0)
Neutro Abs: 5.9 10*3/uL (ref 1.4–7.7)
Platelets: 254 10*3/uL (ref 150.0–400.0)
WBC: 8 10*3/uL (ref 4.5–10.5)

## 2012-01-30 LAB — BASIC METABOLIC PANEL
BUN: 14 mg/dL (ref 6–23)
Chloride: 104 mEq/L (ref 96–112)
GFR: 69.99 mL/min (ref >60.00)
Glucose, Bld: 120 mg/dL — ABNORMAL HIGH (ref 70–99)
Potassium: 4 mEq/L (ref 3.5–5.1)
Sodium: 137 mEq/L (ref 135–145)

## 2012-01-30 LAB — HEMOGLOBIN A1C: Hgb A1c MFr Bld: 5.9 % (ref 4.6–6.5)

## 2012-01-30 LAB — TSH: TSH: 0.98 u[IU]/mL (ref 0.35–5.50)

## 2012-01-30 LAB — LDL CHOLESTEROL, DIRECT: Direct LDL: 160.7 mg/dL

## 2012-01-30 MED ORDER — AMOXICILLIN 500 MG PO TABS: 1000.0000 mg | ORAL_TABLET | Freq: Two times a day (BID) | ORAL | Status: DC

## 2012-01-30 NOTE — Assessment & Plan Note (Signed)
Discussed fitness Give up all sugared drinks, etc

## 2012-01-30 NOTE — Assessment & Plan Note (Signed)
ongoing symptoms Reasonable control with meds

## 2012-01-30 NOTE — Assessment & Plan Note (Signed)
1 week of symptoms Now with referred tooth and ear symptoms Will treat with amoxil

## 2012-01-30 NOTE — Assessment & Plan Note (Signed)
Healthy but still out of shape Discussed healthy eating, regular exercise

## 2012-01-30 NOTE — Progress Notes (Signed)
Subjective:    Patient ID: Donald Evans, male    DOB: Oct 11, 1970, 41 y.o.   MRN: [id number]  HPI Here for physical Wants DOT done  Has had sore throat, pain in left maxilla (like tooth ache) and congestion For about 1 week Yesterday lost balance at [facility] meeting Some cough--some mucus and thick post nasal drip Uses Neti pot regularly  Ongoing asthma Rx Follows with Ramaswamy Uses rescue inhaler rarely (though has used it twice with current illness) Uses vaporizer with [facility]ick[facility]  Anxiety is better Hasn't needed diazepam except very rarely  Current Outpatient Prescriptions on File Prior to Visit  Medication Sig Dispense Refill  . albuterol (PROAIR HFA) 108 (90 BASE) MCG/ACT inhaler Inhale 2 puffs into the lungs every 4 (four) hours as needed.  1 Inhaler  5  . budesonide-formoterol (SYMBICORT) 160-4.5 MCG/ACT inhaler Inhale 2 puffs into the lungs 2 (two) times daily.  1 Inhaler  1  . cetirizine (ZYRTEC) 10 MG tablet Take 10 mg by mouth daily. Taking as needed       . Cyanocobalamin (B-12 PO) Take by mouth daily.        . diazepam (VALIUM) 5 MG tablet Take 1 tablet (5 mg total) by mouth 3 (three) times daily as needed for anxiety.  90 tablet  0  . fluticasone (FLONASE) 50 MCG/ACT nasal spray Place 1 spray into the nose daily. 2 times daily  16 g  1  . gabapentin (NEURONTIN) 300 MG capsule Take 900 mg by mouth daily.       . montelukast (SINGULAIR) 10 MG tablet Take 1 tablet (10 mg total) by mouth daily.  30 tablet  6  . Multiple Vitamin (MULTIVITAMIN) tablet Take 1 tablet by mouth daily.        . pantoprazole (PROTONIX) 40 MG tablet Take 1 tablet (40 mg total) by mouth daily.  30 tablet  1    No Known Allergies  Past Medical History  Diagnosis Date  . Allergy   . Sarcoidosis      Stage 1 sarcoidosis - at age 28.  Spontaneous remission  . GERD (gastroesophageal reflux disease)     No past surgical history on file.  No family history on file.  History   Social History   . Marital Status: Single    Spouse Name: N/A    Number of Children: 1  . Years of Education: N/A   Occupational History  . Works on wells/ septic    Social History Main Topics  . Smoking status: Former Smoker -- 1.0 packs/day for 10 years    Types: Cigarettes    Quit date: 11/03/2004  . Smokeless tobacco: Never Used  . Alcohol Use: Yes     Comment: Occasional to regular  . Drug Use: No  . Sexually Active: Not on file   Other Topics Concern  . Not on file   Social History Narrative  . No narrative on file   Review of Systems  Constitutional: Negative for fatigue and unexpected weight change.       Weight is still up Has stopped alcohol and fast food Wears seat belt  HENT: Positive for congestion, rhinorrhea and tinnitus. Negative for hearing loss and dental problem.        Some ringing in left ear in past week Regular with dentist  Eyes: Negative for visual disturbance.       No diplopia or unilateral vision loss  Respiratory: Positive for cough, chest tightness and shortness of  breath.        Coughs every morning Only occ SOB feeling---inhaler helps  Cardiovascular: Negative for chest pain, palpitations and leg swelling.  Gastrointestinal: Negative for nausea, vomiting, abdominal pain, constipation and blood in stool.       Still with heartburn  Genitourinary: Negative for urgency, frequency and difficulty urinating.       No sexual problems  Musculoskeletal: Positive for arthralgias. Negative for back pain and joint swelling.       Some tightness across shoulders  Skin: Negative for rash.       Has some scattered red spots  Neurological: Negative for dizziness, syncope, weakness, light-headedness, numbness and headaches.  Hematological: Negative for adenopathy. Does not bruise/bleed easily.  Psychiatric/Behavioral: Positive for sleep disturbance. Negative for dysphoric mood. The patient is not nervous/anxious.        Head races at times at night--- seems to sleep  okay but feels like his mind is active at night       Objective:   Physical Exam  Constitutional: He is oriented to person, place, and time. He appears well-developed and well-nourished. No distress.  HENT:  Head: Normocephalic and atraumatic.  Right Ear: External ear normal.  Mouth/Throat: Oropharynx is clear and moist. No oropharyngeal exudate.       Left TM is retracted but not inflamed  Eyes: Conjunctivae normal and EOM are normal. Pupils are equal, round, and reactive to light.  Neck: Normal range of motion. Neck supple. No thyromegaly present.  Cardiovascular: Normal rate, regular rhythm, normal heart sounds and intact distal pulses.  Exam reveals no gallop.   No murmur heard. Pulmonary/Chest: Effort normal and breath sounds normal. No respiratory distress. He has no wheezes. He has no rales.  Abdominal: Soft. There is no tenderness.  Musculoskeletal: He exhibits no edema and no tenderness.  Lymphadenopathy:    He has no cervical adenopathy.  Neurological: He is alert and oriented to person, place, and time.  Skin: No rash noted. No erythema.       Benign angiomas  Psychiatric: He has a normal mood and affect. His behavior is normal.          Assessment & Plan:

## 2012-01-30 NOTE — Assessment & Plan Note (Signed)
Generally controlled Sees Dr Marchelle Gearing

## 2012-01-31 ENCOUNTER — Encounter: Payer: Self-pay | Admitting: *Deleted

## 2012-02-05 ENCOUNTER — Ambulatory Visit (INDEPENDENT_AMBULATORY_CARE_PROVIDER_SITE_OTHER): Payer: BC Managed Care – PPO | Admitting: Internal Medicine

## 2012-02-05 ENCOUNTER — Encounter: Payer: Self-pay | Admitting: Internal Medicine

## 2012-02-05 VITALS — BP 130/90 | HR 97 | Temp 98.7°F | Ht 66.0 in | Wt 212.0 lb

## 2012-02-05 DIAGNOSIS — R05 Cough: Secondary | ICD-10-CM

## 2012-02-05 DIAGNOSIS — J45909 Unspecified asthma, uncomplicated: Secondary | ICD-10-CM

## 2012-02-05 DIAGNOSIS — J4540 Moderate persistent asthma, uncomplicated: Secondary | ICD-10-CM

## 2012-02-05 NOTE — Assessment & Plan Note (Signed)
#  RADS/ASthma  - stable - have flu shot 02/05/2012 today  - continue  Symbicort, singulair scheduled and albuterol as needed

## 2012-02-05 NOTE — Patient Instructions (Addendum)
#  RADS/ASthma  - stable - have flu shot 02/05/2012 today  - continue  Symbicort, singulair scheduled and albuterol as needed  #Cough  glad this is better  conitnue flonase, allergy pill and acid reflux pill  #FOllowup  - 9 months or sooner if needed  - spirometry at followup

## 2012-02-05 NOTE — Progress Notes (Signed)
Subjective:    Patient ID: Donald Evans, male    DOB: 02-12-1971, 41 y.o.   MRN: [id number]  HPI PROB LIST  1. Moderate persistent asthma/RADS. S/p xolair Rx in 2010 and then denied by insurance  - baseline fev1 2.58L/70%  - spirometry 04/11/2011: fev1 2.2L/57%, Ratio 61 - spirometry 02/05/2012:  2.5L/69%, Ratio 64  2. MZ phenotype   3. Allergies to spring, pollen   4. Chronic sinus drainage  - s/p eval by Dr [name] 2011   5. 10 pack smoker - quit 2006   6. Multifactorial cough - LPR type  - sept 2012 - RSI score 24  - oct 2012 - RSI score 26  - dec 2012 - RSI socre 14  - feb 2012 - RSI cough score 22  7. Snoring with ESS 8 - sept 2012   OV 11/02/10  Seeing hm after 14 months for above. Presents with significant other [name]. 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 Following that, heavy snoring at night. ? Pauses at night. Epworth score is 8  However biggest concern is worsening of cough past 3 months. SEvere cough. Worst early in the mporning. Does not wake him up at night. Feels something is stuck in throat. C.o tickle in throat. Cough is loud and horrible per family. Barking quality. Associated gerd is more active than baseline; waking up 2 times a month due to gerd. RSI cores is 24 of 45 and cw LPR cough. There is mild hoarsness, mod-severe clering of throat, severe post nasal drip, mild-mod worsening after lying down, and severe sensation of lump in throat and annoyance from cough with mil-moderate heart burn. He recollects his ENT eval with Dr [name] to be normal in 2011. He is unsure if cough is worse due to asthma worsening. In terms of asthma:denies albuterol rescue use. No nocturnal awakening. No wheeze. Compliant with symbicort and singulair  REC  - multimodal Rx for sinus, gerd, asthma with pred burst, cyclical cough with neurontin and silence   OV 12/21/10: Overall cough is unchanged since last visit. RSI score today is 26  and confirms that his cough is unchanged (clearing of throat, post nasal drop, and severe cough) We reviewed all the Rx measures I had advised. He and his significant other [name] noticed that not talking helped cough significantly for about 3 weeks but then cough recurred. Neurontin helped as well but then he ran out. Taking steroid burst for for asthma helped cough as well but the methylprednisolone made him feelt weird (not edgy like on prednisone, not himself) . He is compliant with symbicort. He is not doing netti pot regularly and I am not sure of how he is doing with flonase. In terms of GERD, he was compliant with diet but now slipping back. For PPI he is taking pantaprazole compliantly (not doing zegerid due to cost).   REC - multimodal Rx for sinus, gerd, asthma, cyclical cough with neurontin and repeat silence   Acute visit 03/07/11 (Dr Delton Coombes)  -- Hx cough, asthma, allergies. Had been doing well, may have developed URI last week, was treated with abx for possible bronchitis. Cough improved. Has had recurrent dry cough over the last 3 - 4 days. Having some increased dyspnea, no wheeze. Has had more nasal drainage. Doing NSW, nasal steroid, zyrtec, singulair, PPI. Planning to see Dr  [name] again on Friday.   REC Please continue all your current medications  Use Tussionex  5cc every 12 hours if needed for cough  OV 04/11/2011 Followup multifactorial cough. After last OV saw Dr Delton Coombes acutely and Tussionex added. States that tussionex making him sleepy so he is taking it only prn at night. Does not take it at night.  Not seen ENT Dr [name] i interim because of insurance issues.  RSI cough score is 22 on 04/11/2011 (Level 5 - xs mucus drainage, and annoying cough, Level 4- sensation of lump in throat, heartburn, Level 3 - clearing throat, and level 1 - hoarseness) and worse than 14 in dec 2012. Spirometry 04/11/2011: fev1 2.2L/57%, Ratio 61 and worse than baseline. He is not giving a good history. I am not  sure how compliant he is with instructions; he is saying he is. STill with same complaints of cough, clearing throat and sinus drainage early in the morning. In addition, he reports that he was mistakenly taking benzo daily instead of prn for panic attacks diagnosed by PMD several years ago and so he is slowly weanning it off now and since wean process started several weeks ago is having random episodes of agitation and shakiness and chest tightness (denies chest pain, or sense of doom or associated diaphoresis, or claustrophobia). He also has new malar rash on face with acneform eruptions  Past, Family, Social reviewed: no change since last visit other than that documented in HPI. Significant other [name] illwith viral    1) Cough is from sinus drainage, asthma, vocal cord dysfunction, acid reflux and all working together to cause cyclical cough/LPR cough.  I am not sure why you are not better  #Sinus drainage  - Continue netti pot do it atleast 3 times per week  - continue flonase  - will refer you to [facility] ENT due to insurance issues with Dr [name] office; wil ensure this is not the case with [facility]NT  \- take Take doxycycline 100mg  po twice daily x 5 days; take after meals and avoid sunlight  # Acid Reflux  - continue [facility] diet for lipids  -continue pantoprazole daily on empty stomach  - take diet sheet from [place] - avoid colas, spices, cheeses, spirits, red meats, beer, chocolates, fried foods etc.,  - sleep with head end of bed elevated and eat small frequent meals  - do not go to bed for 3 hours after last meal  #Asthma  - - continue symbicort 160/4.5 2 puff twice daily  - take pro -air before exercise  #Vocal Cord Dysfunction and Cyclical cough  -stop neurontin due to possible side effect of agitation and feeling lethargic  - do not take tussionex in day time and try to Use is very spraingly  2) Agitation, chest tightness  - do not know what this is  - come off diazepam as  advised by PMD  - stop neurontin  - will reassess at followup  3) Possible Sleep Apnea  - we will continue to monitor this  - focus on weight loss  4) Rash on face for 1 month  - will keep an eye on this  - if persists, wil test for SLE  #Followup  - I will see you in 4-5 weeks.  - Cough score and spirometry at followu   OV 02/05/2012  Fu RADS/ASthma and multifactorial cough  - supposed to return in 4 weeks but he is coming after 10 months. He is feeling well. Cough has resolved. Using albuteorl< 2 times per week. Says he is compliant with meds below. Spirometry -  fev1 2.5L/69% is unchanged from baseline. He has not had but will have flu shot today 02/05/2012   Past, Family, Social reviewed: no change since last visit except is planning to lose wieght through a juice method   Review of Systems  Constitutional: Negative for fever and unexpected weight change.  HENT: Negative for ear pain, nosebleeds, congestion, sore throat, rhinorrhea, sneezing, trouble swallowing, dental problem, postnasal drip and sinus pressure.   Eyes: Negative for redness and itching.  Respiratory: Negative for cough, chest tightness, shortness of breath and wheezing.   Cardiovascular: Negative for palpitations and leg swelling.  Gastrointestinal: Negative for nausea and vomiting.  Genitourinary: Negative for dysuria.  Musculoskeletal: Negative for joint swelling.  Skin: Negative for rash.  Neurological: Negative for headaches.  Hematological: Does not bruise/bleed easily.  Psychiatric/Behavioral: Negative for dysphoric mood. The patient is not nervous/anxious.    Current outpatient prescriptions:albuterol (PROAIR HFA) 108 (90 BASE) MCG/ACT inhaler, Inhale 2 puffs into the lungs every 4 (four) hours as needed., Disp: 1 Inhaler, Rfl: 5;  amoxicillin (AMOXIL) 500 MG tablet, Take 2 tablets (1,000 mg total) by mouth 2 (two) times daily., Disp: 40 tablet, Rfl: 0;  budesonide-formoterol (SYMBICORT) 160-4.5 MCG/ACT  inhaler, Inhale 2 puffs into the lungs 2 (two) times daily., Disp: 1 Inhaler, Rfl: 1 cetirizine (ZYRTEC) 10 MG tablet, Take 10 mg by mouth daily. Taking as needed , Disp: , Rfl: ;  Cyanocobalamin (B-12 PO), Take by mouth daily.  , Disp: , Rfl: ;  diazepam (VALIUM) 5 MG tablet, Take 1 tablet (5 mg total) by mouth 3 (three) times daily as needed for anxiety., Disp: 90 tablet, Rfl: 0;  fluticasone (FLONASE) 50 MCG/ACT nasal spray, Place 1 spray into the nose daily. 2 times daily, Disp: 16 g, Rfl: 1 montelukast (SINGULAIR) 10 MG tablet, Take 1 tablet (10 mg total) by mouth daily., Disp: 30 tablet, Rfl: 6;  Multiple Vitamin (MULTIVITAMIN) tablet, Take 1 tablet by mouth daily.  , Disp: , Rfl: ;  OREGANO PO, Take by mouth daily., Disp: , Rfl: ;  pantoprazole (PROTONIX) 40 MG tablet, Take 1 tablet (40 mg total) by mouth daily., Disp: 30 tablet, Rfl: 1     Objective:   Physical Exam Gen: Pleasant, well-nourished, in no distress,  normal affect  ENT: No lesions,  mouth clear,  oropharynx clear, no postnasal drip  Neck: No JVD, no TMG, no carotid bruits  Lungs: No use of accessory muscles, no dullness to percussion, clear without rales or rhonchi  Cardiovascular: RRR, heart sounds normal, no murmur or gallops, no peripheral edema  Musculoskeletal: No deformities, no cyanosis or clubbing  Neuro: alert, non focal  Skin: Warm, no lesions or rashes except malar rash on face with acenform eruptions             Assessment & Plan:

## 2012-02-05 NOTE — Assessment & Plan Note (Signed)
 #  Cough  glad this is better  conitnue flonase, allergy pill and acid reflux pill  #FOllowup  - 9 months or sooner if needed  - spirometry at followup

## 2012-02-20 ENCOUNTER — Telehealth: Payer: Self-pay | Admitting: Internal Medicine

## 2012-02-20 ENCOUNTER — Ambulatory Visit (INDEPENDENT_AMBULATORY_CARE_PROVIDER_SITE_OTHER): Payer: BC Managed Care – PPO | Admitting: Family Medicine

## 2012-02-20 ENCOUNTER — Encounter: Payer: Self-pay | Admitting: Family Medicine

## 2012-02-20 VITALS — BP 146/96 | HR 106 | Temp 98.2°F | Ht 66.0 in | Wt 215.2 lb

## 2012-02-20 DIAGNOSIS — J209 Acute bronchitis, unspecified: Secondary | ICD-10-CM | POA: Insufficient documentation

## 2012-02-20 MED ORDER — AZITHROMYCIN 250 MG PO TABS: ORAL_TABLET | ORAL | Status: DC

## 2012-02-20 MED ORDER — GUAIFENESIN-CODEINE 100-10 MG/5ML PO SYRP: 5.0000 mL | ORAL_SOLUTION | Freq: Four times a day (QID) | ORAL | Status: DC | PRN

## 2012-02-20 NOTE — Patient Instructions (Addendum)
Take the zithromax as directed  Cough med with caution- it will sedate  Drink lots of fluids and get rest  Continue your inhalers  If you begin to wheeze more please let us know

## 2012-02-20 NOTE — Progress Notes (Signed)
Subjective:    Patient ID: Donald Evans, male    DOB: [birth year], 41 y.o.   MRN: [id number]  HPI Started getting sick early in December - had sinus pressure and retracted TM at his physical exam Was put on amox Over holidays -his brother had bronchitis and the whole family got it  Now he is coughing and hoarse  Is coughing up mucous - brown in color  No fever , has had some hot and cold moments  A little wheeze this am - but not bad  Former smoker with hx of asthma   He uses symbacort and uses proair , also takes Geophysicist/field seismologist pulmonary but it is hard to get in - usually puts him on antibiotics regardless of symptoms given his history   He took some day quil this am  Also BC back and body ache  Patient Active Problem List  Diagnosis  . ALLERGIC RHINITIS, CHRONIC  . GERD  . TRANSAMINASES, SERUM, ELEVATED  . ANXIETY  . Moderate persistent asthma  . Chronic cough  . Obesity  . Snoring  . Routine general medical examination at a health care facility  . Impaired fasting glucose  . Acute sinusitis   Past Medical History  Diagnosis Date  . Allergy   . 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 GERD (gastroesophageal reflux disease)    No past surgical history on file. History  Substance Use Topics  . Smoking status: Former Smoker -- 1.0 packs/day for 10 years    Types: Cigarettes    Quit date: 11/03/2004  . Smokeless tobacco: Never Used  . Alcohol Use: Yes     Comment: Occasional to regular   No family history on file. No Known Allergies Current Outpatient Prescriptions on File Prior to Visit  Medication Sig Dispense Refill  . albuterol (PROAIR HFA) 108 (90 BASE) MCG/ACT inhaler Inhale 2 puffs into the lungs every 4 (four) hours as needed.  1 Inhaler  5  . budesonide-formoterol (SYMBICORT) 160-4.5 MCG/ACT inhaler Inhale 2 puffs into the lungs 2 (two) times daily.  1 Inhaler  1  . cetirizine (ZYRTEC) 10 MG tablet Take 10 mg by  mouth daily. Taking as needed       . Cyanocobalamin (B-12 PO) Take by mouth daily.        . diazepam (VALIUM) 5 MG tablet Take 1 tablet (5 mg total) by mouth 3 (three) times daily as needed for anxiety.  90 tablet  0  . fluticasone (FLONASE) 50 MCG/ACT nasal spray Place 1 spray into the nose daily. 2 times daily  16 g  1  . montelukast (SINGULAIR) 10 MG tablet Take 1 tablet (10 mg total) by mouth daily.  30 tablet  6  . Multiple Vitamin (MULTIVITAMIN) tablet Take 1 tablet by mouth daily.        . pantoprazole (PROTONIX) 40 MG tablet Take 1 tablet (40 mg total) by mouth daily.  30 tablet  1        Review of Systems Review of Systems  Constitutional: Negative for fever, appetite change, and unexpected weight change. pos for fatigue Eyes: Negative for pain and visual disturbance.  Respiratory: Negative for wheeze and shortness of breath.  pos for cough Cardiovascular: Negative for cp or palpitations    Gastrointestinal: Negative for nausea, diarrhea and constipation.  Genitourinary: Negative for urgency and frequency.  Skin: Negative for pallor or rash  Neurological: Negative for weakness, light-headedness, numbness and headaches.  Hematological: Negative for adenopathy. Does not bruise/bleed easily.  Psychiatric/Behavioral: Negative for dysphoric mood. The patient is not nervous/anxious.         Objective:   Physical Exam  Constitutional: He appears well-developed and well-nourished. No distress.       overwt and well app  HENT:  Head: Normocephalic and atraumatic.  Right Ear: External ear normal.  Left Ear: External ear normal.  Mouth/Throat: Oropharynx is clear and moist. No oropharyngeal exudate.       Nares are injected and congested  No sinus tenderness  Eyes: Conjunctivae normal and EOM are normal. Pupils are equal, round, and reactive to light. Right eye exhibits no discharge. Left eye exhibits no discharge.  Neck: Normal range of motion. Neck supple.  Cardiovascular:  Normal rate and regular rhythm.   Pulmonary/Chest: Effort normal and breath sounds normal. No respiratory distress. He has no wheezes. He has no rales. He exhibits no tenderness.       Harsh bs at bases  No wheeze, even on forced exp today No rales or rhonchi  Lymphadenopathy:    He has no cervical adenopathy.  Neurological: He is alert.  Skin: Skin is warm and dry.  Psychiatric: He has a normal mood and affect.          Assessment & Plan:

## 2012-02-20 NOTE — Assessment & Plan Note (Signed)
In pt with hx of lung dz - (reactive airways are quiet today however) Cover with zpak in light of prior hx Robitussin with codeine prn with caution Fluids/rest Update if not starting to improve in a week or if worsening  -esp if wheezing

## 2012-02-20 NOTE — Telephone Encounter (Signed)
Patient Information:  Caller Name: Mat Carne  Phone: [phone]  Patient: Donald Evans, Donald Evans  Gender: Male  DOB: 1971/02/15  Age: 41 Years  PCP: Tillman Abide [facility])  Office Follow Up:  Does the office need to follow up with this patient?: Yes  Instructions For The Office: Appts are full. Please call pt back.   Symptoms  Reason For Call & Symptoms: Pt has cough and congestion.  Pt states it is deep in his chest. He has intermittent wheezing at times. Not severe.  Reviewed Health History In EMR: Yes  Reviewed Medications In EMR: Yes  Reviewed Allergies In EMR: Yes  Reviewed Surgeries / Procedures: Yes  Date of Onset of Symptoms: 01/29/2012  Treatments Tried: Pt was prescribed Amoxicillin on 01/29/12 for a sinus infection which seemed to clear up but the cold moved down into his chest.  Treatments Tried Worked: No  Guideline(s) Used:  Cough  Disposition Per Guideline:   Go to Office Now  Reason For Disposition Reached:   Wheezing is present  Advice Given:  N/A

## 2012-02-20 NOTE — Telephone Encounter (Signed)
Scheduled pt appt with Dr Milinda Antis for 3:00 on 02/20/12.

## 2012-02-26 ENCOUNTER — Telehealth: Payer: Self-pay | Admitting: Internal Medicine

## 2012-02-26 NOTE — Telephone Encounter (Signed)
Patient Information:  Caller Name: Mat Carne  Phone: [phone]  Patient: Donald Evans, Donald Evans  Gender: Male  DOB: [birth year]  Age: 42 Years  PCP: Tillman Abide [facility])  Office Follow Up:  Does the office need to follow up with this patient?: No  Instructions For The Office: N/A  RN Note:  Temp not taken; reports feeling chilled and hot at times. Used Albuterol twice during night.  More coughing at night when supine. No wheezing during day. Wheezing at night began 02/25/12. Reports aching jaw/sinus pain. Hydrate & humidify.  May use generic Mucinex to loosen cough.   Symptoms  Reason For Call & Symptoms: Ongoing productive cough with brown phlem and wheezing at night  Reviewed Health History In EMR: Yes  Reviewed Medications In EMR: Yes  Reviewed Allergies In EMR: Yes  Reviewed Surgeries / Procedures: Yes  Date of Onset of Symptoms: 02/18/2012  Treatments Tried: Codeine cough medication, Zpack, Albuterol MDI  Treatments Tried Worked: Yes  Any Fever: Yes  Fever Taken: Tactile  Fever Time Of Reading: 08:00:00  Fever Last Reading: N/A  Guideline(s) Used:  Asthma Attack  Disposition Per Guideline:   See Today or Tomorrow in Office  Reason For Disposition Reached:   Sinus pain (around cheekbone or eye)  Advice Given:  Quick-Relief Asthma Medicine:   Start your quick-relief medicine (e.g., albuterol, salbutamol) at the first sign of any coughing or shortness of breath (don't wait for wheezing). Use your inhaler (2 puffs each time) or nebulizer every 4 hours. Continue the quick-relief medicine until you have not wheezed or coughed for 48 hours.  The best "cough medicine" for an adult with asthma is always the asthma medicine (Note: Don't use cough suppressants, but cough drops may help a tickly cough).  Drinking Liquids:  Try to drink normal amount of liquids (e.g., water). Being adequately hydrated makes it easier to cough up the sticky lung mucus.  Avoid Triggers:  Avoid  known triggers of asthma attacks (e.g., tobacco smoke, cats, other pets, feather pillows, exercise).  Expected Course:  If treatment is started early, most asthma attacks are quickly brought under control. All wheezing should be gone by 5 days.  Call Back If:  Inhaled asthma medicine (nebulizer or inhaler) is needed more often than every 4 hours  Wheezing has not completely cleared after 5 days  You become worse.  Appointment Scheduled:  02/27/2012 11:00:00 Appointment Scheduled Provider:  Tillman Abide [facility])

## 2012-02-26 NOTE — Telephone Encounter (Signed)
Patient on the scheduled for tomorrow

## 2012-02-26 NOTE — Telephone Encounter (Signed)
Please check to see if they would prefer today at 3:30PM

## 2012-02-27 ENCOUNTER — Ambulatory Visit (INDEPENDENT_AMBULATORY_CARE_PROVIDER_SITE_OTHER): Payer: BC Managed Care – PPO | Admitting: Internal Medicine

## 2012-02-27 ENCOUNTER — Encounter: Payer: Self-pay | Admitting: Internal Medicine

## 2012-02-27 VITALS — BP 140/90 | HR 86 | Temp 98.3°F | Wt 212.0 lb

## 2012-02-27 DIAGNOSIS — J4540 Moderate persistent asthma, uncomplicated: Secondary | ICD-10-CM

## 2012-02-27 DIAGNOSIS — J45909 Unspecified asthma, uncomplicated: Secondary | ICD-10-CM

## 2012-02-27 DIAGNOSIS — J019 Acute sinusitis, unspecified: Secondary | ICD-10-CM

## 2012-02-27 MED ORDER — AMOXICILLIN-POT CLAVULANATE 875-125 MG PO TABS: 1.0000 | ORAL_TABLET | Freq: Two times a day (BID) | ORAL | Status: DC

## 2012-02-27 NOTE — Progress Notes (Signed)
Subjective:    Patient ID: Donald Evans, male    DOB: 03-28-1970, 42 y.o.   MRN: [id number]  HPI Doesn't reallyi feel any better Sore throat is improved Still coughing--productive of thick brown sticky phlegm Now with body aches---especially in neck and low back and flank (like with cough)  Now with some pain under the left ear Does have some intermittent wheezing---mostly at night and AM Awakening at 3AM with nasal congestion----hard to breathe through mouth. Wheezing then No sig SOB though--did use rescue inhaler three times yesterday (does help)  Temp 99 last night and this AM Goes from hot to cold in day---not bad at night  Finished the z-pak Finished cough med also  Current Outpatient Prescriptions on File Prior to Visit  Medication Sig Dispense Refill  . albuterol (PROAIR HFA) 108 (90 BASE) MCG/ACT inhaler Inhale 2 puffs into the lungs every 4 (four) hours as needed.  1 Inhaler  5  . budesonide-formoterol (SYMBICORT) 160-4.5 MCG/ACT inhaler Inhale 2 puffs into the lungs 2 (two) times daily.  1 Inhaler  1  . cetirizine (ZYRTEC) 10 MG tablet Take 10 mg by mouth daily. Taking as needed       . Cyanocobalamin (B-12 PO) Take by mouth daily.        . diazepam (VALIUM) 5 MG tablet Take 1 tablet (5 mg total) by mouth 3 (three) times daily as needed for anxiety.  90 tablet  0  . fluticasone (FLONASE) 50 MCG/ACT nasal spray Place 1 spray into the nose daily. 2 times daily  16 g  1  . montelukast (SINGULAIR) 10 MG tablet Take 1 tablet (10 mg total) by mouth daily.  30 tablet  6  . Multiple Vitamin (MULTIVITAMIN) tablet Take 1 tablet by mouth daily.        . pantoprazole (PROTONIX) 40 MG tablet Take 1 tablet (40 mg total) by mouth daily.  30 tablet  1    No Known Allergies  Past Medical History  Diagnosis Date  . Allergy   . Sarcoidosis      Stage 1 sarcoidosis - at age 52.  Spontaneous remission  . GERD (gastroesophageal reflux disease)     No past surgical history on  file.  No family history on file.  History   Social History  . Marital Status: Single    Spouse Name: N/A    Number of Children: 1  . Years of Education: N/A   Occupational History  . Works on wells/ septic    Social History Main Topics  . Smoking status: Former Smoker -- 1.0 packs/day for 10 years    Types: Cigarettes    Quit date: 11/03/2004  . Smokeless tobacco: Never Used  . Alcohol Use: Yes     Comment: Occasional to regular  . Drug Use: No  . Sexually Active: Not on file   Other Topics Concern  . Not on file   Social History Narrative  . No narrative on file   Review of Systems No vomiting  Some loose stools Appetite is off     Objective:   Physical Exam  Constitutional: He appears well-developed and well-nourished. No distress.  HENT:  Mouth/Throat: Oropharynx is clear and moist. No oropharyngeal exudate.       Slight maxillary tenderness Marked nasal inflammation and swelling TMs normal  Neck: Normal range of motion. Neck supple.  Pulmonary/Chest: Effort normal. No respiratory distress. He has no wheezes. He has no rales.  Slight exp rhonchi but not tight  Lymphadenopathy:    He has no cervical adenopathy.          Assessment & Plan:

## 2012-02-27 NOTE — Assessment & Plan Note (Signed)
Ongoing symptoms Failed z-pak Will try augmentin Change to levaquin if not better within 3 days or so

## 2012-02-27 NOTE — Assessment & Plan Note (Signed)
No sig exacerbation Will use albuterol inhaler prn

## 2012-03-06 ENCOUNTER — Telehealth: Payer: Self-pay | Admitting: Internal Medicine

## 2012-03-06 MED ORDER — MONTELUKAST SODIUM 10 MG PO TABS: 10.0000 mg | ORAL_TABLET | Freq: Every day | ORAL | Status: DC

## 2012-03-06 MED ORDER — PANTOPRAZOLE SODIUM 40 MG PO TBEC: 40.0000 mg | DELAYED_RELEASE_TABLET | Freq: Every day | ORAL | Status: DC

## 2012-03-06 MED ORDER — BUDESONIDE-FORMOTEROL FUMARATE 160-4.5 MCG/ACT IN AERO: 2.0000 | INHALATION_SPRAY | Freq: Two times a day (BID) | RESPIRATORY_TRACT | Status: DC

## 2012-03-06 NOTE — Telephone Encounter (Signed)
Called and lmomtcb for the pts wife to see if they want 90 day supply or 30 day supply of meds.

## 2012-03-06 NOTE — Telephone Encounter (Signed)
Spoke with Waynetta Sandy and she is aware Rx's sent to pharmacy.

## 2012-03-06 NOTE — Telephone Encounter (Signed)
Pt's wife states her phone died on her & she can be reached on her husband's cell:  [phone].  Pt would like 90 day supplies on all of his meds that are getting called in if possible.  Antionette Fairy

## 2012-06-20 ENCOUNTER — Other Ambulatory Visit: Payer: Self-pay | Admitting: *Deleted

## 2012-06-20 MED ORDER — DIAZEPAM 5 MG PO TABS: 5.0000 mg | ORAL_TABLET | Freq: Three times a day (TID) | ORAL | Status: DC | PRN

## 2012-06-20 NOTE — Telephone Encounter (Signed)
rx called into pharmacy

## 2012-06-20 NOTE — Telephone Encounter (Signed)
Okay #90 x 0 

## 2012-06-20 NOTE — Telephone Encounter (Signed)
Last filled 11/29/2011

## 2012-10-18 ENCOUNTER — Telehealth: Payer: Self-pay | Admitting: Internal Medicine

## 2012-10-18 NOTE — Telephone Encounter (Signed)
left messages to schedule follow up apt. no return call back. sent letter 10/18/12 °

## 2012-10-29 ENCOUNTER — Other Ambulatory Visit: Payer: Self-pay | Admitting: Internal Medicine

## 2012-10-30 NOTE — Telephone Encounter (Signed)
Okay #90 x 0 

## 2012-10-30 NOTE — Telephone Encounter (Signed)
rx called into pharmacy

## 2013-01-31 ENCOUNTER — Encounter: Payer: BC Managed Care – PPO | Admitting: Internal Medicine

## 2013-04-22 ENCOUNTER — Telehealth: Payer: Self-pay | Admitting: Internal Medicine

## 2013-04-22 MED ORDER — BUDESONIDE-FORMOTEROL FUMARATE 160-4.5 MCG/ACT IN AERO: 2.0000 | INHALATION_SPRAY | Freq: Two times a day (BID) | RESPIRATORY_TRACT | Status: DC

## 2013-04-22 MED ORDER — ALBUTEROL SULFATE HFA 108 (90 BASE) MCG/ACT IN AERS: 2.0000 | INHALATION_SPRAY | RESPIRATORY_TRACT | Status: DC | PRN

## 2013-04-22 NOTE — Telephone Encounter (Signed)
Spoke with pt's wife. Pt has not been seen since 2013. Advised her that an appointment would need to be scheduled. ROV has been scheduled for 05/07/13 at 10:15am. [facility]

## 2013-05-07 ENCOUNTER — Encounter (INDEPENDENT_AMBULATORY_CARE_PROVIDER_SITE_OTHER): Payer: Self-pay

## 2013-05-07 ENCOUNTER — Encounter: Payer: Self-pay | Admitting: Internal Medicine

## 2013-05-07 ENCOUNTER — Ambulatory Visit (INDEPENDENT_AMBULATORY_CARE_PROVIDER_SITE_OTHER): Payer: BC Managed Care – PPO | Admitting: Internal Medicine

## 2013-05-07 VITALS — BP 140/80 | HR 93 | Ht 66.0 in | Wt 179.0 lb

## 2013-05-07 DIAGNOSIS — J45909 Unspecified asthma, uncomplicated: Secondary | ICD-10-CM

## 2013-05-07 DIAGNOSIS — J4540 Moderate persistent asthma, uncomplicated: Secondary | ICD-10-CM

## 2013-05-07 LAB — PULMONARY FUNCTION TEST
FEF 25-75 Pre: 1.47 L/sec
FEF2575-%PRED-PRE: 43 %
FEV1-%PRED-PRE: 73 %
FEV1-PRE: 2.64 L
FEV1FVC-%PRED-PRE: 77 %
FEV6-%Pred-Pre: 96 %
FEV6-PRE: 4.24 L
FEV6FVC-%Pred-Pre: 101 %
FVC-%Pred-Pre: 95 %
FVC-PRE: 4.31 L
Pre FEV1/FVC ratio: 61 %
Pre FEV6/FVC Ratio: 98 %

## 2013-05-07 MED ORDER — BECLOMETHASONE DIPROPIONATE 80 MCG/ACT IN AERS: 2.0000 | INHALATION_SPRAY | RESPIRATORY_TRACT | Status: DC | PRN

## 2013-05-07 NOTE — Patient Instructions (Addendum)
Glad you are overall better and so is your RADS/asthma and cough Continue natural oil based treatment Use albuterol as needed Use QVAR 2 puff as needed (as discussed this is an adjustment to treatment regimen based on shared decision)  Followup 9 months or sooner if needed Spirometry at followup

## 2013-05-07 NOTE — Progress Notes (Signed)
Subjective:    Patient ID: Donald Evans, male    DOB: [birth year], 43 y.o.   MRN: [id number]  HPI  . Moderate persistent asthma/RADS. S/p xolair Rx in 2010 and then denied by insurance  - baseline fev1 2.58L/70%  - spirometry 04/11/2011: fev1 2.2L/57%, Ratio 61 - spirometry 02/05/2012:  2.5L/69%, Ratio 64 - spirometry 05/07/2013 feb1 2.64L/73%, rato 61 v- BEST EVER (after oil RX)   2. MZ phenotype   3. Allergies to spring, pollen   4. Chronic sinus drainage  - s/p eval by Dr Donald Evans 2011   5. 10 pack smoker - quit 2006   6. Multifactorial cough - LPR type  - sept 2012 - RSI score 24  - oct 2012 - RSI score 26  - dec 2012 - RSI socre 14  - feb 2012 - RSI cough score 22  7. Snoring with ESS 8 - sept 2012   OV 11/02/10  Seeing hm after 14 months for above. Presents with significant other Donald Evans. 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 Following that, heavy snoring at night. ? Pauses at night. Epworth score is 8  However biggest concern is worsening of cough past 3 months. SEvere cough. Worst early in the mporning. Does not wake him up at night. Feels something is stuck in throat. C.o tickle in throat. Cough is loud and horrible per family. Barking quality. Associated gerd is more active than baseline; waking up 2 times a month due to gerd. RSI cores is 24 of 45 and cw LPR cough. There is mild hoarsness, mod-severe clering of throat, severe post nasal drip, mild-mod worsening after lying down, and severe sensation of lump in throat and annoyance from cough with mil-moderate heart burn. He recollects his ENT eval with Dr Donald Evans to be normal in 2011. He is unsure if cough is worse due to asthma worsening. In terms of asthma:denies albuterol rescue use. No nocturnal awakening. No wheeze. Compliant with symbicort and singulair  REC  - multimodal Rx for sinus, gerd, asthma with pred burst, cyclical cough with neurontin and silence   OV 12/21/10:  Overall cough is unchanged since last visit. RSI score today is 26 and confirms that his cough is unchanged (clearing of throat, post nasal drop, and severe cough) We reviewed all the Rx measures I had advised. He and his significant other Donald Evans noticed that not talking helped cough significantly for about 3 weeks but then cough recurred. Neurontin helped as well but then he ran out. Taking steroid burst for for asthma helped cough as well but the methylprednisolone made him feelt weird (not edgy like on prednisone, not himself) . He is compliant with symbicort. He is not doing netti pot regularly and I am not sure of how he is doing with flonase. In terms of GERD, he was compliant with diet but now slipping back. For PPI he is taking pantaprazole compliantly (not doing zegerid due to cost).   REC - multimodal Rx for sinus, gerd, asthma, cyclical cough with neurontin and repeat silence   Acute visit 03/07/11 (Dr Donald Evans)  -- Hx cough, asthma, allergies. Had been doing well, may have developed URI last week, was treated with abx for possible bronchitis. Cough improved. Has had recurrent dry cough over the last 3 - 4 days. Having some increased dyspnea, no wheeze. Has had more nasal drainage. Doing NSW, nasal steroid, zyrtec, singulair, PPI. Planning to see Dr  Donald Evans again on  Friday.   REC Please continue all your current medications  Use Tussionex 5cc every 12 hours if needed for cough  OV 04/11/2011 Followup multifactorial cough. After last OV saw Dr Donald Evans acutely and Tussionex added. States that tussionex making him sleepy so he is taking it only prn at night. Does not take it at night.  Not seen ENT Dr Donald Evans i interim because of insurance issues.  RSI cough score is 22 on 04/11/2011 (Level 5 - xs mucus drainage, and annoying cough, Level 4- sensation of lump in throat, heartburn, Level 3 - clearing throat, and level 1 - hoarseness) and worse than 14 in dec 2012. Spirometry 04/11/2011: fev1 2.2L/57%, Ratio 61  and worse than baseline. He is not giving a good history. I am not sure how compliant he is with instructions; he is saying he is. STill with same complaints of cough, clearing throat and sinus drainage early in the morning. In addition, he reports that he was mistakenly taking benzo daily instead of prn for panic attacks diagnosed by PMD several years ago and so he is slowly weanning it off now and since wean process started several weeks ago is having random episodes of agitation and shakiness and chest tightness (denies chest pain, or sense of doom or associated diaphoresis, or claustrophobia). He also has new malar rash on face with acneform eruptions  Past, Family, Social reviewed: no change since last visit other than that documented in HPI. Significant other Donald Evans illwith viral     OV 02/05/2012  Fu RADS/ASthma and multifactorial cough  - supposed to return in 4 weeks but he is coming after 10 months. He is feeling well. Cough has resolved. Using albuteorl< 2 times per week. Says he is compliant with meds below. Spirometry - fev1 2.5L/69% is unchanged from baseline. He has not had but will have flu shot today 02/05/2012   Past, Family, Social reviewed: no change since last visit except is planning to lose wieght through a juice method  REC #RADS/ASthma  - stable - have flu shot 02/05/2012 today  - continue  Symbicort, singulair scheduled and albuterol as needed  #Cough  glad this is better  conitnue flonase, allergy pill and acid reflux pill  #FOllowup  - 9 months or sooner if needed  - spirometry at followup   OV 05/07/2013  Chief Complaint  Patient presents with  . Follow-up    for asthma. pt states breathing has been "good." No complaints today.    Fu RADS/ASthma and multifactorial cough  Last seen December 2013. Presents with his wife Donald Evans. Since seeing me last he has lost almost 50 pounds of weight intentionally. This has come through following a natural diet. In  addition for his asthma and sinuses he and his wife did extensive research and is adopted the usage of natural oils in the form of oils in the diet and asked aromatherapy and is inhalational therapy. He gets these oils from a company called [employer]. They showed me an encyclopdia with pictures of different oils from around the world and its medicinal properties. The specific instructions on what to use for asthma and for chronic sinus drainage. The also attest that their son who had difficulty attention deficit has also improved on this oral regimen.  Currently this no active asthma except when exposed to cats, cigarettes. Albuterol use is rare. Is not using his inhaled Symbicort anymore because it has steroids. He is open to a very low dose ICS prn so he  can balance his objective of weight loss and asthma control.  Denies active cough other than minimal cough and minimal shortness of breath and wheezing only present  Spirometry: spirometry 05/07/2013 feb1 2.64L/73%, rato 61 v- BEST EVER (after oil RX)    Review of Systems  Constitutional: Negative for fever and unexpected weight change.  HENT: Negative for congestion, dental problem, ear pain, nosebleeds, postnasal drip, rhinorrhea, sinus pressure, sneezing, sore throat and trouble swallowing.   Eyes: Negative for redness and itching.  Respiratory: Negative for cough, chest tightness, shortness of breath and wheezing.   Cardiovascular: Negative for palpitations and leg swelling.  Gastrointestinal: Negative for nausea and vomiting.  Genitourinary: Negative for dysuria.  Musculoskeletal: Negative for joint swelling.  Skin: Negative for rash.  Neurological: Negative for headaches.  Hematological: Does not bruise/bleed easily.  Psychiatric/Behavioral: Negative for dysphoric mood. The patient is not nervous/anxious.        Objective:   Physical Exam  Nursing note and vitals reviewed. Constitutional: He is oriented to person, place, and  time. He appears well-developed and well-nourished. No distress.  Significant weight loss looks healthy Body mass index is 28.91 kg/(m^2).   HENT:  Head: Normocephalic and atraumatic.  Right Ear: External ear normal.  Left Ear: External ear normal.  Mouth/Throat: Oropharynx is clear and moist. No oropharyngeal exudate.  Improved postnasal drainage compared to the past  Eyes: Conjunctivae and EOM are normal. Pupils are equal, round, and reactive to light. Right eye exhibits no discharge. Left eye exhibits no discharge. No scleral icterus.  Neck: Normal range of motion. Neck supple. No JVD present. No tracheal deviation present. No thyromegaly present.  Cardiovascular: Normal rate, regular rhythm and intact distal pulses.  Exam reveals no gallop and no friction rub.   No murmur heard. Pulmonary/Chest: Effort normal and breath sounds normal. No respiratory distress. He has no wheezes. He has no rales. He exhibits no tenderness.  No wheezing  Abdominal: Soft. Bowel sounds are normal. He exhibits no distension and no mass. There is no tenderness. There is no rebound and no guarding.  Musculoskeletal: Normal range of motion. He exhibits no edema and no tenderness.  Lymphadenopathy:    He has no cervical adenopathy.  Neurological: He is alert and oriented to person, place, and time. He has normal reflexes. No cranial nerve deficit. Coordination normal.  Skin: Skin is warm and dry. No rash noted. He is not diaphoretic. No erythema. No pallor.  Psychiatric: He has a normal mood and affect. His behavior is normal. Judgment and thought content normal.          Assessment & Plan:

## 2013-05-07 NOTE — Progress Notes (Signed)
Spirometry done today. 

## 2013-05-07 NOTE — Assessment & Plan Note (Signed)
He is subjectively LOT better and objectively some better with weigh tloss, and natural oil based Rx (vapors, diet) that he and his wife strongly attest to it s healing properties. I have supported them through their journey. Seems to work really well for sinus drainag. He is cough free. Lung function is stable to some improved. He does not want to take daily ICS due to weight gain. So, he agreed to taking QVAR (low dose with small particles) prn basis  Will see hium in 9 months with spiro at [facility]  > 50% of this > 25 min visit spent in face to face counseling (15 min visit converted to 25 min)

## 2013-07-03 ENCOUNTER — Encounter: Payer: BC Managed Care – PPO | Admitting: Internal Medicine

## 2013-12-31 ENCOUNTER — Encounter: Payer: BC Managed Care – PPO | Admitting: Internal Medicine

## 2014-05-11 ENCOUNTER — Telehealth: Payer: Self-pay | Admitting: Internal Medicine

## 2014-05-11 NOTE — Telephone Encounter (Signed)
Ran into  Him [name] and [name] outside [facility] 05/11/2014; his step dad is a paitent in [facility]. Please give him first avail fu. He says in past 1 year no AE asthma or ER visit due to nature rx

## 2014-05-14 NOTE — Telephone Encounter (Signed)
lmtcb for pt.  

## 2014-05-18 NOTE — Telephone Encounter (Signed)
lmtcb for pt.  

## 2014-05-20 ENCOUNTER — Encounter: Payer: Self-pay | Admitting: Emergency Medicine

## 2014-05-20 NOTE — Telephone Encounter (Signed)
lmtcb for pt. Will send letter to pt to call to schedule an appt. Will sign off on phone note.

## 2015-02-01 ENCOUNTER — Encounter: Payer: Self-pay | Admitting: Internal Medicine

## 2015-02-01 ENCOUNTER — Ambulatory Visit (INDEPENDENT_AMBULATORY_CARE_PROVIDER_SITE_OTHER): Payer: Self-pay | Admitting: Internal Medicine

## 2015-02-01 VITALS — BP 160/80 | HR 100 | Temp 97.8°F | Wt 193.0 lb

## 2015-02-01 DIAGNOSIS — IMO0001 Reserved for inherently not codable concepts without codable children: Secondary | ICD-10-CM | POA: Insufficient documentation

## 2015-02-01 DIAGNOSIS — R030 Elevated blood-pressure reading, without diagnosis of hypertension: Secondary | ICD-10-CM

## 2015-02-01 MED ORDER — AMLODIPINE BESYLATE 5 MG PO TABS: 5.0000 mg | ORAL_TABLET | Freq: Every day | ORAL | Status: DC

## 2015-02-01 NOTE — Assessment & Plan Note (Signed)
BP Readings from Last 3 Encounters:  02/01/15 160/80  05/07/13 140/80  02/27/12 140/90   Usually okay but high now Went up to 172/102 on right No symptoms Still on decongestant  Will stop the decongestant Start amlodipine just in case Recheck 1 month--- discussed edema, orthostatic dizziness, etc

## 2015-02-01 NOTE — Patient Instructions (Signed)
You can take tylenol but avoid ibuprofen for now. NO DECONGESTANTS! Let me know if you get bad leg swelling or significant dizziness
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
## 2015-02-01 NOTE — Progress Notes (Signed)
Pre visit review using our clinic review tool, if applicable. No additional management support is needed unless otherwise documented below in the visit note. 

## 2015-03-05 ENCOUNTER — Encounter: Payer: Self-pay | Admitting: Internal Medicine

## 2015-03-05 ENCOUNTER — Ambulatory Visit (INDEPENDENT_AMBULATORY_CARE_PROVIDER_SITE_OTHER): Payer: Self-pay | Admitting: Internal Medicine

## 2015-03-05 VITALS — BP 150/80 | HR 54 | Temp 97.5°F | Wt 196.0 lb

## 2015-03-05 DIAGNOSIS — I10 Essential (primary) hypertension: Secondary | ICD-10-CM

## 2015-03-05 MED ORDER — TRIAMTERENE-HCTZ 37.5-25 MG PO TABS: 1.0000 | ORAL_TABLET | Freq: Every day | ORAL | Status: DC

## 2015-03-05 NOTE — Patient Instructions (Signed)
DASH Eating Plan  DASH stands for "Dietary Approaches to Stop Hypertension." The DASH eating plan is a healthy eating plan that has been shown to reduce high blood pressure (hypertension). Additional health benefits may include reducing the risk of type 2 diabetes mellitus, heart disease, and stroke. The DASH eating plan may also help with weight loss.  WHAT DO I NEED TO KNOW ABOUT THE DASH EATING PLAN?  For the DASH eating plan, you will follow these general guidelines:  · Choose foods with a percent daily value for sodium of less than 5% (as listed on the food label).  · Use salt-free seasonings or herbs instead of table salt or sea salt.  · Check with your health care provider or pharmacist before using salt substitutes.  · Eat lower-sodium products, often labeled as "lower sodium" or "no salt added."  · Eat fresh foods.  · Eat more vegetables, fruits, and low-fat dairy products.  · Choose whole grains. Look for the word "whole" as the first word in the ingredient list.  · Choose fish and skinless chicken or turkey more often than red meat. Limit fish, poultry, and meat to 6 oz (170 g) each day.  · Limit sweets, desserts, sugars, and sugary drinks.  · Choose heart-healthy fats.  · Limit cheese to 1 oz (28 g) per day.  · Eat more home-cooked food and less restaurant, buffet, and fast food.  · Limit fried foods.  · Cook foods using methods other than frying.  · Limit canned vegetables. If you do use them, rinse them well to decrease the sodium.  · When eating at a restaurant, ask that your food be prepared with less salt, or no salt if possible.  WHAT FOODS CAN I EAT?  Seek help from a dietitian for individual calorie needs.  Grains  Whole grain or whole wheat bread. Brown rice. Whole grain or whole wheat pasta. Quinoa, bulgur, and whole grain cereals. Low-sodium cereals. Corn or whole wheat flour tortillas. Whole grain cornbread. Whole grain crackers. Low-sodium crackers.  Vegetables  Fresh or frozen vegetables  (raw, steamed, roasted, or grilled). Low-sodium or reduced-sodium tomato and vegetable juices. Low-sodium or reduced-sodium tomato sauce and paste. Low-sodium or reduced-sodium canned vegetables.   Fruits  All fresh, canned (in natural juice), or frozen fruits.  Meat and Other Protein Products  Ground beef (85% or leaner), grass-fed beef, or beef trimmed of fat. Skinless chicken or turkey. Ground chicken or turkey. Pork trimmed of fat. All fish and seafood. Eggs. Dried beans, peas, or lentils. Unsalted nuts and seeds. Unsalted canned beans.  Dairy  Low-fat dairy products, such as skim or 1% milk, 2% or reduced-fat cheeses, low-fat ricotta or cottage cheese, or plain low-fat yogurt. Low-sodium or reduced-sodium cheeses.  Fats and Oils  Tub margarines without trans fats. Light or reduced-fat mayonnaise and salad dressings (reduced sodium). Avocado. Safflower, olive, or canola oils. Natural peanut or almond butter.  Other  Unsalted popcorn and pretzels.  The items listed above may not be a complete list of recommended foods or beverages. Contact your dietitian for more options.  WHAT FOODS ARE NOT RECOMMENDED?  Grains  White bread. White pasta. White rice. Refined cornbread. Bagels and croissants. Crackers that contain trans fat.  Vegetables  Creamed or fried vegetables. Vegetables in a cheese sauce. Regular canned vegetables. Regular canned tomato sauce and paste. Regular tomato and vegetable juices.  Fruits  Dried fruits. Canned fruit in light or heavy syrup. Fruit juice.  Meat and Other Protein   Products  Fatty cuts of meat. Ribs, chicken wings, bacon, sausage, bologna, salami, chitterlings, fatback, hot dogs, bratwurst, and packaged luncheon meats. Salted nuts and seeds. Canned beans with salt.  Dairy  Whole or 2% milk, cream, half-and-half, and cream cheese. Whole-fat or sweetened yogurt. Full-fat cheeses or blue cheese. Nondairy creamers and whipped toppings. Processed cheese, cheese spreads, or cheese  curds.  Condiments  Onion and garlic salt, seasoned salt, table salt, and sea salt. Canned and packaged gravies. Worcestershire sauce. Tartar sauce. Barbecue sauce. Teriyaki sauce. Soy sauce, including reduced sodium. Steak sauce. Fish sauce. Oyster sauce. Cocktail sauce. Horseradish. Ketchup and mustard. Meat flavorings and tenderizers. Bouillon cubes. Hot sauce. Tabasco sauce. Marinades. Taco seasonings. Relishes.  Fats and Oils  Butter, stick margarine, lard, shortening, ghee, and bacon fat. Coconut, palm kernel, or palm oils. Regular salad dressings.  Other  Pickles and olives. Salted popcorn and pretzels.  The items listed above may not be a complete list of foods and beverages to avoid. Contact your dietitian for more information.  WHERE CAN I FIND MORE INFORMATION?  National Heart, Lung, and Blood Institute: www.nhlbi.nih.gov/health/health-topics/topics/dash/     This information is not intended to replace advice given to you by your health care provider. Make sure you discuss any questions you have with your health care provider.     Document Released: 01/26/2011 Document Revised: 02/27/2014 Document Reviewed: 12/11/2012  Elsevier Interactive Patient Education ©2016 Elsevier Inc.
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
## 2015-03-05 NOTE — Assessment & Plan Note (Signed)
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

## 2015-03-05 NOTE — Progress Notes (Signed)
Pre visit review using our clinic review tool, if applicable. No additional management support is needed unless otherwise documented below in the visit note. 

## 2015-04-06 ENCOUNTER — Ambulatory Visit: Payer: Self-pay | Admitting: Internal Medicine

## 2015-04-07 ENCOUNTER — Telehealth: Payer: Self-pay | Admitting: Internal Medicine

## 2015-04-07 NOTE — Telephone Encounter (Signed)
Left message asking to call office °

## 2015-04-07 NOTE — Telephone Encounter (Signed)
Left message asking pt to call office  °

## 2015-04-07 NOTE — Telephone Encounter (Signed)
He needs a follow up within the next couple of weeks to follow up on HTN

## 2015-04-07 NOTE — Telephone Encounter (Signed)
Patient did not come for their scheduled appointment 04/06/15 for 1 month follow up.  Please let me know if the patient needs to be contacted immediately for follow up or if no follow up is necessary.

## 2015-04-09 ENCOUNTER — Other Ambulatory Visit: Payer: Self-pay

## 2015-04-09 ENCOUNTER — Encounter: Payer: Self-pay | Admitting: Internal Medicine

## 2015-04-09 MED ORDER — AMLODIPINE BESYLATE 5 MG PO TABS: 5.0000 mg | ORAL_TABLET | Freq: Every day | ORAL | Status: DC

## 2015-04-09 NOTE — Telephone Encounter (Signed)
Patient's wife called.  Patient is out of town.  Patient's wife scheduled appointment for patient on 04/16/15.

## 2015-04-09 NOTE — Telephone Encounter (Signed)
[name] request refill amlodipine to [facility]. Pt last seen 03/05/15 and has f/u appt on 04/16/15; pt out of med. Refilled # 30. [name] voiced understanding.

## 2015-04-09 NOTE — Telephone Encounter (Signed)
Left message asking pt to call office Mailed letter 

## 2015-04-16 ENCOUNTER — Encounter: Payer: Self-pay | Admitting: Internal Medicine

## 2015-04-16 ENCOUNTER — Ambulatory Visit (INDEPENDENT_AMBULATORY_CARE_PROVIDER_SITE_OTHER): Payer: Self-pay | Admitting: Internal Medicine

## 2015-04-16 VITALS — BP 146/90 | HR 88 | Temp 97.6°F | Wt 198.8 lb

## 2015-04-16 DIAGNOSIS — I10 Essential (primary) hypertension: Secondary | ICD-10-CM

## 2015-04-16 MED ORDER — TRIAMTERENE-HCTZ 37.5-25 MG PO TABS: 1.0000 | ORAL_TABLET | Freq: Every day | ORAL | Status: DC

## 2015-04-16 MED ORDER — AMLODIPINE BESYLATE 5 MG PO TABS: 5.0000 mg | ORAL_TABLET | Freq: Every day | ORAL | Status: DC

## 2015-04-16 NOTE — Progress Notes (Signed)
   Subjective:    Patient ID: Donald Evans, male    DOB: May 27, 1970, 45 y.o.   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 triamterene-hydrochlorothiazide (MAXZIDE-25) 37.5-25 MG tablet Take 1 tablet by mouth daily. 30 tablet 11   No current facility-administered medications on file prior to visit.    No Known Allergies  Past Medical History  Diagnosis Date  . Allergy   . Sarcoidosis (HCC)      Stage 1 sarcoidosis - at age 74.  Spontaneous remission  . GERD (gastroesophageal reflux disease)   . Hypertension     No past surgical history on file.  No family history on file.  Social History   Social History  . Marital Status: Single    Spouse Name: N/A  . Number of Children: 1  . Years of Education: N/A   Occupational History  . Works on wells/ septic    Social History Main Topics  . Smoking status: Former Smoker -- 1.00 packs/day for 10 years    Types: Cigarettes    Quit date: 11/03/2004  . Smokeless tobacco: Never Used  . Alcohol Use: Yes     Comment: Occasional to regular  . Drug Use: No  . Sexual Activity: Not on file   Other Topics Concern  . Not on file   Social History Narrative   Review of Systems  No edema Appetite is fine     Objective:   Physical Exam  Constitutional: He appears well-developed and well-nourished. No distress.  Neck: Normal range of motion. Neck supple. No thyromegaly present.  Cardiovascular: Normal rate, regular rhythm and normal heart sounds.  Exam reveals no gallop.   No murmur heard. Pulmonary/Chest: Effort normal and breath sounds normal. No respiratory distress. He has no wheezes. He has no rales.  Musculoskeletal: He exhibits no edema.    Lymphadenopathy:    He has no cervical adenopathy.          Assessment & Plan:

## 2015-04-16 NOTE — Progress Notes (Signed)
Pre visit review using our clinic review tool, if applicable. No additional management support is needed unless otherwise documented below in the visit note. 

## 2015-04-16 NOTE — Assessment & Plan Note (Signed)
BP Readings from Last 3 Encounters:  04/16/15 146/90  03/05/15 150/80  02/01/15 160/80   Repeat 154/90 on right Apparently needs both meds for now He has info on DASH eating plan Discussed fitness

## 2015-06-23 ENCOUNTER — Telehealth: Payer: Self-pay | Admitting: Internal Medicine

## 2015-06-23 NOTE — Telephone Encounter (Signed)
LMTC x 1  

## 2015-06-23 NOTE — Telephone Encounter (Signed)
Received call from pt's wife. Pt is requesting a refill on albuterol hfa. Pt last seen by MR in 04/2013. Appt made with MR on 07/15/15.   MR please advise if ok to refill as it has been 2 years since pt has been seen. Thanks.

## 2015-06-24 MED ORDER — ALBUTEROL SULFATE HFA 108 (90 BASE) MCG/ACT IN AERS: 2.0000 | INHALATION_SPRAY | Freq: Four times a day (QID) | RESPIRATORY_TRACT | Status: DC | PRN

## 2015-06-24 NOTE — Telephone Encounter (Signed)
Ok to refill. Donald Evans was the first patient I walked into on my first day at [facility]. You can ask him or [name] that

## 2015-06-24 NOTE — Telephone Encounter (Signed)
Spoke with pt's wife. She is aware that we will send in refill. Rx has been sent in. Nothing further was needed.

## 2015-07-14 ENCOUNTER — Ambulatory Visit: Payer: Self-pay | Admitting: Internal Medicine

## 2015-07-15 ENCOUNTER — Ambulatory Visit: Payer: Self-pay | Admitting: Internal Medicine

## 2015-07-28 ENCOUNTER — Telehealth: Payer: Self-pay | Admitting: Internal Medicine

## 2015-07-28 ENCOUNTER — Ambulatory Visit: Payer: Self-pay | Admitting: Internal Medicine

## 2015-07-28 DIAGNOSIS — Z0289 Encounter for other administrative examinations: Secondary | ICD-10-CM

## 2015-07-28 NOTE — Telephone Encounter (Signed)
Patient did not come for their scheduled appointment today for 3 month follow up Please let me know if the patient needs to be contacted immediately for follow up or if no follow up is necessary.   ° °

## 2015-07-28 NOTE — Telephone Encounter (Signed)
Please have him reschedule in the next month or so to make sure his BP is okay now

## 2015-07-28 NOTE — Telephone Encounter (Signed)
Left message asking pt to call office  °

## 2015-08-03 NOTE — Telephone Encounter (Signed)
6/21 pt aware

## 2015-08-11 ENCOUNTER — Encounter: Payer: Self-pay | Admitting: Internal Medicine

## 2015-08-11 ENCOUNTER — Ambulatory Visit (INDEPENDENT_AMBULATORY_CARE_PROVIDER_SITE_OTHER): Payer: Self-pay | Admitting: Internal Medicine

## 2015-08-11 VITALS — BP 140/74 | HR 99 | Temp 98.4°F | Wt 188.0 lb

## 2015-08-11 DIAGNOSIS — I10 Essential (primary) hypertension: Secondary | ICD-10-CM

## 2015-08-11 DIAGNOSIS — E785 Hyperlipidemia, unspecified: Secondary | ICD-10-CM | POA: Insufficient documentation

## 2015-08-11 LAB — CBC WITH DIFFERENTIAL/PLATELET
BASOS ABS: 0 10*3/uL (ref 0.0–0.1)
BASOS PCT: 0.4 % (ref 0.0–3.0)
EOS ABS: 0.1 10*3/uL (ref 0.0–0.7)
Eosinophils Relative: 1.2 % (ref 0.0–5.0)
HEMATOCRIT: 46 % (ref 39.0–52.0)
Hemoglobin: 15.3 g/dL (ref 13.0–17.0)
LYMPHS ABS: 1.4 10*3/uL (ref 0.7–4.0)
LYMPHS PCT: 17.6 % (ref 12.0–46.0)
MCHC: 33.3 g/dL (ref 30.0–36.0)
MCV: 87.7 fl (ref 78.0–100.0)
MONO ABS: 1.1 10*3/uL — AB (ref 0.1–1.0)
Monocytes Relative: 13.8 % — ABNORMAL HIGH (ref 3.0–12.0)
NEUTROS ABS: 5.4 10*3/uL (ref 1.4–7.7)
NEUTROS PCT: 67 % (ref 43.0–77.0)
PLATELETS: 226 10*3/uL (ref 150.0–400.0)
RBC: 5.24 Mil/uL (ref 4.22–5.81)
RDW: 14.8 % (ref 11.5–15.5)
WBC: 8.1 10*3/uL (ref 4.0–10.5)

## 2015-08-11 LAB — COMPREHENSIVE METABOLIC PANEL
ALT: 24 U/L (ref 0–53)
AST: 16 U/L (ref 0–37)
Albumin: 4.6 g/dL (ref 3.5–5.2)
Alkaline Phosphatase: 56 U/L (ref 39–117)
BILIRUBIN TOTAL: 0.8 mg/dL (ref 0.2–1.2)
BUN: 17 mg/dL (ref 6–23)
CALCIUM: 10 mg/dL (ref 8.4–10.5)
CHLORIDE: 99 meq/L (ref 96–112)
CO2: 28 meq/L (ref 19–32)
CREATININE: 1.02 mg/dL (ref 0.40–1.50)
GFR: 83.11 mL/min (ref >60.00)
GLUCOSE: 98 mg/dL (ref 70–99)
Potassium: 3.7 mEq/L (ref 3.5–5.1)
SODIUM: 136 meq/L (ref 135–145)
Total Protein: 7.3 g/dL (ref 6.0–8.3)

## 2015-08-11 LAB — LIPID PANEL
Cholesterol: 189 mg/dL (ref 0–200)
HDL: 39.5 mg/dL (ref >39.00)
NONHDL: 149
TRIGLYCERIDES: 249 mg/dL — AB (ref 0.0–149.0)
Total CHOL/HDL Ratio: 5
VLDL: 49.8 mg/dL — ABNORMAL HIGH (ref 0.0–40.0)

## 2015-08-11 LAB — LDL CHOLESTEROL, DIRECT: Direct LDL: 98 mg/dL

## 2015-08-11 NOTE — Addendum Note (Signed)
Addended by: Baldomero LamyHAVERS, Sana Tessmer C on: 08/11/2015 04:28 PM   Modules accepted: Kipp BroodSmartSet

## 2015-08-11 NOTE — Assessment & Plan Note (Signed)
BP Readings from Last 3 Encounters:  08/11/15 140/74  04/16/15 146/90  03/05/15 150/80   Better now Tolerating meds Will check labs

## 2015-08-11 NOTE — Progress Notes (Signed)
Pre visit review using our clinic review tool, if applicable. No additional management support is needed unless otherwise documented below in the visit note. 

## 2015-08-11 NOTE — Assessment & Plan Note (Signed)
Discussed primary prevention Will recheck but hold off for now

## 2015-08-11 NOTE — Progress Notes (Signed)
   Subjective:    Patient ID: Donald Evans, male    DOB: Jun 29, 1970, 45 y.o.   MRN: [id number]  HPI Here for follow up of HTN  Is on both BP meds Back fine with the CDL  Has been more careful with eating Cut out sodas--though some half sweet tea Does some juicing--like for breakfast Physically active at work-- discussed days off  No chest pain No SOB No dizziness or syncope No edema  Current Outpatient Prescriptions on File Prior to Visit  Medication Sig Dispense Refill  . albuterol (PROAIR HFA) 108 (90 Base) MCG/ACT inhaler Inhale 2 puffs into the lungs every 6 (six) hours as needed for wheezing or shortness of breath. 1 Inhaler 0  . amLODipine (NORVASC) 5 MG tablet Take 1 tablet (5 mg total) by mouth daily. 90 tablet 3  . triamterene-hydrochlorothiazide (MAXZIDE-25) 37.5-25 MG tablet Take 1 tablet by mouth daily. 90 tablet 3   No current facility-administered medications on file prior to visit.    No Known Allergies  Past Medical History  Diagnosis Date  . Allergy   . Sarcoidosis (HCC)      Stage 1 sarcoidosis - at age 45.  Spontaneous remission  . GERD (gastroesophageal reflux disease)   . Hypertension     No past surgical history on file.  No family history on file.  Social History   Social History  . Marital Status: Single    Spouse Name: N/A  . Number of Children: 1  . Years of Education: N/A   Occupational History  . Works on wells/ septic    Social History Main Topics  . Smoking status: Former Smoker -- 1.00 packs/day for 10 years    Types: Cigarettes    Quit date: 11/03/2004  . Smokeless tobacco: Never Used  . Alcohol Use: Yes     Comment: Occasional to regular  . Drug Use: No  . Sexual Activity: Not on file   Other Topics Concern  . Not on file   Social History Narrative   Review of Systems Sleeps well Appetite is good Weight down 10#    Objective:   Physical Exam  Constitutional: He appears well-developed and well-nourished.  No distress.  Neck: Normal range of motion. Neck supple. No thyromegaly present.  Cardiovascular: Normal rate, regular rhythm and normal heart sounds.  Exam reveals no gallop.   No murmur heard. Pulmonary/Chest: Effort normal and breath sounds normal. No respiratory distress. He has no wheezes. He has no rales.  Musculoskeletal: He exhibits no edema.  Lymphadenopathy:    He has no cervical adenopathy.          Assessment & Plan:

## 2016-04-17 ENCOUNTER — Other Ambulatory Visit: Payer: Self-pay | Admitting: Internal Medicine

## 2016-08-14 ENCOUNTER — Encounter: Payer: Self-pay | Admitting: Internal Medicine

## 2016-08-28 ENCOUNTER — Encounter: Payer: Self-pay | Admitting: Internal Medicine

## 2016-08-28 DIAGNOSIS — Z0289 Encounter for other administrative examinations: Secondary | ICD-10-CM

## 2016-11-01 ENCOUNTER — Other Ambulatory Visit: Payer: Self-pay

## 2016-11-01 MED ORDER — TRIAMTERENE-HCTZ 37.5-25 MG PO TABS: 1.0000 | ORAL_TABLET | Freq: Every day | ORAL | 0 refills | Status: DC

## 2016-11-01 MED ORDER — AMLODIPINE BESYLATE 5 MG PO TABS: 5.0000 mg | ORAL_TABLET | Freq: Every day | ORAL | 0 refills | Status: DC

## 2016-11-01 NOTE — Telephone Encounter (Signed)
[name] left v/m; unable to reach pt and I spoke with [name] to let her know to ck with phamacy and pt needs to cb for appt. [name] voiced understanding. Refilled # 30 on each to [facility]. Pt no showed CPX on 08/28/16.

## 2017-01-19 ENCOUNTER — Encounter: Payer: Self-pay | Admitting: Internal Medicine

## 2017-01-19 ENCOUNTER — Ambulatory Visit (INDEPENDENT_AMBULATORY_CARE_PROVIDER_SITE_OTHER): Payer: Self-pay | Admitting: Internal Medicine

## 2017-01-19 VITALS — BP 176/96 | HR 96 | Temp 98.5°F | Wt 192.2 lb

## 2017-01-19 DIAGNOSIS — I10 Essential (primary) hypertension: Secondary | ICD-10-CM

## 2017-01-19 MED ORDER — AMLODIPINE BESYLATE 5 MG PO TABS: 5.0000 mg | ORAL_TABLET | Freq: Every day | ORAL | 1 refills | Status: DC

## 2017-01-19 MED ORDER — ALBUTEROL SULFATE HFA 108 (90 BASE) MCG/ACT IN AERS: 2.0000 | INHALATION_SPRAY | Freq: Four times a day (QID) | RESPIRATORY_TRACT | 1 refills | Status: DC | PRN

## 2017-01-19 MED ORDER — TRIAMTERENE-HCTZ 37.5-25 MG PO TABS: 1.0000 | ORAL_TABLET | Freq: Every day | ORAL | 1 refills | Status: DC

## 2017-01-19 NOTE — Assessment & Plan Note (Signed)
BP Readings from Last 3 Encounters:  01/19/17 (!) 176/96  08/11/15 140/74  04/16/15 (!) 146/90   Generally okay when on his meds Will restart meds PE in 3 months

## 2017-01-19 NOTE — Progress Notes (Signed)
   Subjective:    Patient ID: Donald Evans, male    DOB: 04/28/1970, 46 y.o.   MRN: [id number]  HPI Here for follow up of HTN Hasn't been seen in over a year  Works out of town a lot---so trouble getting in Ran out of extra 30 days about 2 weeks ago When taking the medication, his BP is "fine". Doesn't remember numbers but wife keeps up with it  Last CDL almost 2 years ago---due soon Was fine last time  Current Outpatient Medications on File Prior to Visit  Medication Sig Dispense Refill  . albuterol (PROAIR HFA) 108 (90 Base) MCG/ACT inhaler Inhale 2 puffs into the lungs every 6 (six) hours as needed for wheezing or shortness of breath. 1 Inhaler 0  . amLODipine (NORVASC) 5 MG tablet Take 1 tablet (5 mg total) by mouth daily. 30 tablet 0  . triamterene-hydrochlorothiazide (MAXZIDE-25) 37.5-25 MG tablet Take 1 tablet by mouth daily. 30 tablet 0   No current facility-administered medications on file prior to visit.     No Known Allergies  Past Medical History:  Diagnosis Date  . Allergy   . GERD (gastroesophageal reflux disease)   . Hyperlipidemia   . Hypertension   . Sarcoidosis     Stage 1 sarcoidosis - at age 46.  Spontaneous remission    History reviewed. No pertinent surgical history.  History reviewed. No pertinent family history.  Social History   Socioeconomic History  . Marital status: Single    Spouse name: Not on file  . Number of children: 1  . Years of education: Not on file  . Highest education level: Not on file  Social Needs  . Financial resource strain: Not on file  . Food insecurity - worry: Not on file  . Food insecurity - inability: Not on file  . Transportation needs - medical: Not on file  . Transportation needs - non-medical: Not on file  Occupational History  . Occupation: Works on wells/ septic    Employer: [employer]  Tobacco Use  . Smoking status: Former Smoker    Packs/day: 1.00    Years: 10.00    Pack years:  10.00    Types: Cigarettes    Last attempt to quit: 11/03/2004    Years since quitting: 12.2  . Smokeless tobacco: Never Used  Substance and Sexual Activity  . Alcohol use: Yes    Comment: Occasional to regular  . Drug use: No  . Sexual activity: Not on file  Other Topics Concern  . Not on file  Social History Narrative  . Not on file   Review of Systems  Having some SOB with cat exposure--needs albuterol again No chest pain No dizziness     Objective:   Physical Exam  Constitutional: No distress.  Neck: No thyromegaly present.  Cardiovascular: Normal rate and normal heart sounds. Exam reveals no gallop.  No murmur heard. Pulmonary/Chest: Effort normal and breath sounds normal. No respiratory distress. He has no wheezes. He has no rales.  Musculoskeletal: He exhibits no edema.  Lymphadenopathy:    He has no cervical adenopathy.  Psychiatric: He has a normal mood and affect. His behavior is normal.          Assessment & Plan:

## 2017-01-26 ENCOUNTER — Ambulatory Visit: Payer: Self-pay | Admitting: Internal Medicine

## 2017-04-19 ENCOUNTER — Ambulatory Visit: Payer: Self-pay | Admitting: Internal Medicine

## 2017-07-25 ENCOUNTER — Ambulatory Visit: Payer: Self-pay | Admitting: Internal Medicine

## 2017-07-25 ENCOUNTER — Encounter: Payer: Self-pay | Admitting: Internal Medicine

## 2017-07-25 VITALS — BP 140/88 | HR 84 | Temp 98.3°F | Ht 66.0 in | Wt 181.0 lb

## 2017-07-25 DIAGNOSIS — Z23 Encounter for immunization: Secondary | ICD-10-CM

## 2017-07-25 DIAGNOSIS — J4520 Mild intermittent asthma, uncomplicated: Secondary | ICD-10-CM

## 2017-07-25 DIAGNOSIS — Z0000 Encounter for general adult medical examination without abnormal findings: Secondary | ICD-10-CM

## 2017-07-25 DIAGNOSIS — I10 Essential (primary) hypertension: Secondary | ICD-10-CM

## 2017-07-25 MED ORDER — LOSARTAN POTASSIUM 50 MG PO TABS: 50.0000 mg | ORAL_TABLET | Freq: Every day | ORAL | 3 refills | Status: DC

## 2017-07-25 MED ORDER — AMLODIPINE BESYLATE 5 MG PO TABS: 5.0000 mg | ORAL_TABLET | Freq: Every day | ORAL | 3 refills | Status: DC

## 2017-07-25 NOTE — Assessment & Plan Note (Signed)
BP Readings from Last 3 Encounters:  07/25/17 140/88  01/19/17 (!) 176/96  08/11/15 140/74   Much better but gout with the HCTZ Will change to losartan

## 2017-07-25 NOTE — Assessment & Plan Note (Signed)
Does well with occ inhaler

## 2017-07-25 NOTE — Progress Notes (Signed)
Subjective:    Patient ID: Donald Evans, male    DOB: May 27, 1970, 47 y.o.   MRN: [id number]  HPI Here for physical  Doing fine Stopped sodas, eating better Has lost 11# Working out with 5 year old son--weights  No problems with BP meds  Did get flare of gout in right great toe Discussed the HCTZ as a cause  Current Outpatient Medications on File Prior to Visit  Medication Sig Dispense Refill  . albuterol (PROAIR HFA) 108 (90 Base) MCG/ACT inhaler Inhale 2 puffs into the lungs every 6 (six) hours as needed for wheezing or shortness of breath. 1 Inhaler 1  . amLODipine (NORVASC) 5 MG tablet Take 1 tablet (5 mg total) by mouth daily. 90 tablet 1  . triamterene-hydrochlorothiazide (MAXZIDE-25) 37.5-25 MG tablet Take 1 tablet by mouth daily. 90 tablet 1   No current facility-administered medications on file prior to visit.     No Known Allergies  Past Medical History:  Diagnosis Date  . Allergy   . GERD (gastroesophageal reflux disease)   . Hyperlipidemia   . Hypertension   . Sarcoidosis     Stage 1 sarcoidosis - at age 17.  Spontaneous remission    History reviewed. No pertinent surgical history.  History reviewed. No pertinent family history.  Social History   Socioeconomic History  . Marital status: Single    Spouse name: Not on file  . Number of children: 1  . Years of education: Not on file  . Highest education level: Not on file  Occupational History  . Occupation: Works on wells/ septic    Employer: [employer]  Social Needs  . Financial resource strain: Not on file  . Food insecurity:    Worry: Not on file    Inability: Not on file  . Transportation needs:    Medical: Not on file    Non-medical: Not on file  Tobacco Use  . Smoking status: Former Smoker    Packs/day: 1.00    Years: 10.00    Pack years: 10.00    Types: Cigarettes    Last attempt to quit: 11/03/2004    Years since quitting: 12.7  . Smokeless tobacco: Never Used    Substance and Sexual Activity  . Alcohol use: Yes    Comment: Occasional to regular  . Drug use: No  . Sexual activity: Not on file  Lifestyle  . Physical activity:    Days per week: Not on file    Minutes per session: Not on file  . Stress: Not on file  Relationships  . Social connections:    Talks on phone: Not on file    Gets together: Not on file    Attends religious service: Not on file    Active member of club or organization: Not on file    Attends meetings of clubs or organizations: Not on file    Relationship status: Not on file  . Intimate partner violence:    Fear of current or ex partner: Not on file    Emotionally abused: Not on file    Physically abused: Not on file    Forced sexual activity: Not on file  Other Topics Concern  . Not on file  Social History Narrative  . Not on file   Review of Systems  Constitutional: Negative for fatigue.       Wears seat belt  HENT: Negative for dental problem, hearing loss, tinnitus and trouble swallowing.  Keeps up with dentist  Eyes: Negative for visual disturbance.       No diplopia or unilateral vision loss  Respiratory: Negative for cough, chest tightness and shortness of breath.   Cardiovascular: Negative for chest pain, palpitations and leg swelling.  Gastrointestinal: Negative for blood in stool and constipation.       No recent heartburn  Endocrine: Negative for polydipsia and polyuria.  Genitourinary: Negative for difficulty urinating and urgency.       No sexual problems  Musculoskeletal: Negative for back pain.       1st MTP pain on right  Skin: Negative for rash.       No suspicious lesions  Allergic/Immunologic: Positive for environmental allergies. Negative for immunocompromised state.       Only uses OTC as needed  Neurological: Negative for dizziness, syncope, light-headedness and headaches.  Hematological: Negative for adenopathy. Does not bruise/bleed easily.  Psychiatric/Behavioral:  Negative for dysphoric mood and sleep disturbance. The patient is not nervous/anxious.        Objective:   Physical Exam  Constitutional: He is oriented to person, place, and time. He appears well-developed. No distress.  HENT:  Head: Normocephalic and atraumatic.  Right Ear: External ear normal.  Left Ear: External ear normal.  Mouth/Throat: Oropharynx is clear and moist. No oropharyngeal exudate.  Eyes: Pupils are equal, round, and reactive to light. Conjunctivae are normal.  Neck: No thyromegaly present.  Cardiovascular: Normal rate, regular rhythm, normal heart sounds and intact distal pulses. Exam reveals no gallop.  No murmur heard. Respiratory: Effort normal and breath sounds normal. No respiratory distress. He has no wheezes. He has no rales.  GI: Soft. There is no tenderness.  Musculoskeletal: He exhibits no edema or tenderness.  Lymphadenopathy:    He has no cervical adenopathy.  Neurological: He is alert and oriented to person, place, and time.  Skin: No rash noted. No erythema.  Psychiatric: He has a normal mood and affect. His behavior is normal.           Assessment & Plan:

## 2017-07-25 NOTE — Patient Instructions (Signed)
DASH Eating Plan DASH stands for "Dietary Approaches to Stop Hypertension." The DASH eating plan is a healthy eating plan that has been shown to reduce high blood pressure (hypertension). It may also reduce your risk for type 2 diabetes, heart disease, and stroke. The DASH eating plan may also help with weight loss. What are tips for following this plan? General guidelines  Avoid eating more than 2,300 mg (milligrams) of salt (sodium) a day. If you have hypertension, you may need to reduce your sodium intake to 1,500 mg a day.  Limit alcohol intake to no more than 1 drink a day for nonpregnant women and 2 drinks a day for men. One drink equals 12 oz of beer, 5 oz of wine, or 1 oz of hard liquor.  Work with your health care provider to maintain a healthy body weight or to lose weight. Ask what an ideal weight is for you.  Get at least 30 minutes of exercise that causes your heart to beat faster (aerobic exercise) most days of the week. Activities may include walking, swimming, or biking.  Work with your health care provider or diet and nutrition specialist (dietitian) to adjust your eating plan to your individual calorie needs. Reading food labels  Check food labels for the amount of sodium per serving. Choose foods with less than 5 percent of the Daily Value of sodium. Generally, foods with less than 300 mg of sodium per serving fit into this eating plan.  To find whole grains, look for the word "whole" as the first word in the ingredient list. Shopping  Buy products labeled as "low-sodium" or "no salt added."  Buy fresh foods. Avoid canned foods and premade or frozen meals. Cooking  Avoid adding salt when cooking. Use salt-free seasonings or herbs instead of table salt or sea salt. Check with your health care provider or pharmacist before using salt substitutes.  Do not fry foods. Cook foods using healthy methods such as baking, boiling, grilling, and broiling instead.  Cook with  heart-healthy oils, such as olive, canola, soybean, or sunflower oil. Meal planning   Eat a balanced diet that includes: ? 5 or more servings of fruits and vegetables each day. At each meal, try to fill half of your plate with fruits and vegetables. ? Up to 6-8 servings of whole grains each day. ? Less than 6 oz of lean meat, poultry, or fish each day. A 3-oz serving of meat is about the same size as a deck of cards. One egg equals 1 oz. ? 2 servings of low-fat dairy each day. ? A serving of nuts, seeds, or beans 5 times each week. ? Heart-healthy fats. Healthy fats called Omega-3 fatty acids are found in foods such as flaxseeds and coldwater fish, like sardines, salmon, and mackerel.  Limit how much you eat of the following: ? Canned or prepackaged foods. ? Food that is high in trans fat, such as fried foods. ? Food that is high in saturated fat, such as fatty meat. ? Sweets, desserts, sugary drinks, and other foods with added sugar. ? Full-fat dairy products.  Do not salt foods before eating.  Try to eat at least 2 vegetarian meals each week.  Eat more home-cooked food and less restaurant, buffet, and fast food.  When eating at a restaurant, ask that your food be prepared with less salt or no salt, if possible. What foods are recommended? The items listed may not be a complete list. Talk with your dietitian about what   dietary choices are best for you. Grains Whole-grain or whole-wheat bread. Whole-grain or whole-wheat pasta. Brown rice. Oatmeal. Quinoa. Bulgur. Whole-grain and low-sodium cereals. Pita bread. Low-fat, low-sodium crackers. Whole-wheat flour tortillas. Vegetables Fresh or frozen vegetables (raw, steamed, roasted, or grilled). Low-sodium or reduced-sodium tomato and vegetable juice. Low-sodium or reduced-sodium tomato sauce and tomato paste. Low-sodium or reduced-sodium canned vegetables. Fruits All fresh, dried, or frozen fruit. Canned fruit in natural juice (without  added sugar). Meat and other protein foods Skinless chicken or turkey. Ground chicken or turkey. Pork with fat trimmed off. Fish and seafood. Egg whites. Dried beans, peas, or lentils. Unsalted nuts, nut butters, and seeds. Unsalted canned beans. Lean cuts of beef with fat trimmed off. Low-sodium, lean deli meat. Dairy Low-fat (1%) or fat-free (skim) milk. Fat-free, low-fat, or reduced-fat cheeses. Nonfat, low-sodium ricotta or cottage cheese. Low-fat or nonfat yogurt. Low-fat, low-sodium cheese. Fats and oils Soft margarine without trans fats. Vegetable oil. Low-fat, reduced-fat, or light mayonnaise and salad dressings (reduced-sodium). Canola, safflower, olive, soybean, and sunflower oils. Avocado. Seasoning and other foods Herbs. Spices. Seasoning mixes without salt. Unsalted popcorn and pretzels. Fat-free sweets. What foods are not recommended? The items listed may not be a complete list. Talk with your dietitian about what dietary choices are best for you. Grains Baked goods made with fat, such as croissants, muffins, or some breads. Dry pasta or rice meal packs. Vegetables Creamed or fried vegetables. Vegetables in a cheese sauce. Regular canned vegetables (not low-sodium or reduced-sodium). Regular canned tomato sauce and paste (not low-sodium or reduced-sodium). Regular tomato and vegetable juice (not low-sodium or reduced-sodium). Pickles. Olives. Fruits Canned fruit in a light or heavy syrup. Fried fruit. Fruit in cream or butter sauce. Meat and other protein foods Fatty cuts of meat. Ribs. Fried meat. Bacon. Sausage. Bologna and other processed lunch meats. Salami. Fatback. Hotdogs. Bratwurst. Salted nuts and seeds. Canned beans with added salt. Canned or smoked fish. Whole eggs or egg yolks. Chicken or turkey with skin. Dairy Whole or 2% milk, cream, and half-and-half. Whole or full-fat cream cheese. Whole-fat or sweetened yogurt. Full-fat cheese. Nondairy creamers. Whipped toppings.  Processed cheese and cheese spreads. Fats and oils Butter. Stick margarine. Lard. Shortening. Ghee. Bacon fat. Tropical oils, such as coconut, palm kernel, or palm oil. Seasoning and other foods Salted popcorn and pretzels. Onion salt, garlic salt, seasoned salt, table salt, and sea salt. Worcestershire sauce. Tartar sauce. Barbecue sauce. Teriyaki sauce. Soy sauce, including reduced-sodium. Steak sauce. Canned and packaged gravies. Fish sauce. Oyster sauce. Cocktail sauce. Horseradish that you find on the shelf. Ketchup. Mustard. Meat flavorings and tenderizers. Bouillon cubes. Hot sauce and Tabasco sauce. Premade or packaged marinades. Premade or packaged taco seasonings. Relishes. Regular salad dressings. Where to find more information:  National Heart, Lung, and Blood Institute: www.nhlbi.nih.gov  American Heart Association: www.heart.org Summary  The DASH eating plan is a healthy eating plan that has been shown to reduce high blood pressure (hypertension). It may also reduce your risk for type 2 diabetes, heart disease, and stroke.  With the DASH eating plan, you should limit salt (sodium) intake to 2,300 mg a day. If you have hypertension, you may need to reduce your sodium intake to 1,500 mg a day.  When on the DASH eating plan, aim to eat more fresh fruits and vegetables, whole grains, lean proteins, low-fat dairy, and heart-healthy fats.  Work with your health care provider or diet and nutrition specialist (dietitian) to adjust your eating plan to your individual   calorie needs. This information is not intended to replace advice given to you by your health care provider. Make sure you discuss any questions you have with your health care provider. Document Released: 01/26/2011 Document Revised: 01/31/2016 Document Reviewed: 01/31/2016 Elsevier Interactive Patient Education  2018 Elsevier Inc.  

## 2017-07-25 NOTE — Addendum Note (Signed)
Addended by: Eual FinesBRIDGES, SHANNON P on: 07/25/2017 11:48 AM   Modules accepted: Orders

## 2017-07-25 NOTE — Assessment & Plan Note (Signed)
Healthy Has worked on lifestyle and is doing much better Needs Tdap No cancer screening till next year

## 2017-08-18 ENCOUNTER — Other Ambulatory Visit: Payer: Self-pay | Admitting: Internal Medicine

## 2017-08-27 ENCOUNTER — Other Ambulatory Visit: Payer: Self-pay

## 2017-11-06 ENCOUNTER — Telehealth: Payer: Self-pay

## 2017-11-06 NOTE — Telephone Encounter (Signed)
Pt had a CPE in June and was to come back for fasting labs. He has never done that. Dr Alphonsus SiasLetvak is asking that he schedule his fasting lab appointment. The orders are already in.  If pt calls back, please schedule him a fasting lab appt. Thanks

## 2017-11-08 ENCOUNTER — Telehealth: Payer: Self-pay | Admitting: Internal Medicine

## 2017-11-08 MED ORDER — LOSARTAN POTASSIUM 100 MG PO TABS: 100.0000 mg | ORAL_TABLET | Freq: Every day | ORAL | 3 refills | Status: DC

## 2017-11-08 NOTE — Telephone Encounter (Signed)
Copied from CRM [phone]. Topic: Quick Communication - See Telephone Encounter >> Nov 08, 2017  8:06 AM Luanna Coleawoud, Jessica L wrote: CRM for notification. See Telephone encounter for: 11/08/17.amLODipine (NORVASC) 5 MG tablet [[id number]][[id number]]  pt called and stated that medication is on back order. Pt would like to know if something else could be called in. Please advise

## 2017-11-08 NOTE — Telephone Encounter (Signed)
Please let him know that we can just try increasing the losartan to 100mg  daily and stop the amlodipine for now. I sent a new prescription He will need to check his BP over the next few weeks to make sure it is controlled He can also use two of the 50mg  at a time--so as not to waste them

## 2017-11-08 NOTE — Telephone Encounter (Signed)
Spoke to pt. He will monitor his BP and let us know if it is not in range.

## 2017-11-16 ENCOUNTER — Other Ambulatory Visit (INDEPENDENT_AMBULATORY_CARE_PROVIDER_SITE_OTHER): Payer: Self-pay

## 2017-11-16 DIAGNOSIS — I10 Essential (primary) hypertension: Secondary | ICD-10-CM

## 2017-11-16 LAB — COMPREHENSIVE METABOLIC PANEL
ALBUMIN: 4.2 g/dL (ref 3.5–5.2)
ALT: 20 U/L (ref 0–53)
AST: 14 U/L (ref 0–37)
Alkaline Phosphatase: 52 U/L (ref 39–117)
BILIRUBIN TOTAL: 0.6 mg/dL (ref 0.2–1.2)
BUN: 18 mg/dL (ref 6–23)
CALCIUM: 9 mg/dL (ref 8.4–10.5)
CO2: 25 mEq/L (ref 19–32)
CREATININE: 1.07 mg/dL (ref 0.40–1.50)
Chloride: 104 mEq/L (ref 96–112)
GFR: 77.9 mL/min (ref >60.00)
Glucose, Bld: 109 mg/dL — ABNORMAL HIGH (ref 70–99)
Potassium: 4 mEq/L (ref 3.5–5.1)
Sodium: 137 mEq/L (ref 135–145)
Total Protein: 6.8 g/dL (ref 6.0–8.3)

## 2017-11-16 LAB — CBC
HCT: 39.8 % (ref 39.0–52.0)
HEMOGLOBIN: 13.9 g/dL (ref 13.0–17.0)
MCHC: 34.8 g/dL (ref 30.0–36.0)
MCV: 86.9 fl (ref 78.0–100.0)
PLATELETS: 222 10*3/uL (ref 150.0–400.0)
RBC: 4.58 Mil/uL (ref 4.22–5.81)
RDW: 14.6 % (ref 11.5–15.5)
WBC: 6.6 10*3/uL (ref 4.0–10.5)

## 2017-12-05 ENCOUNTER — Telehealth: Payer: Self-pay | Admitting: Internal Medicine

## 2017-12-05 MED ORDER — DIAZEPAM 5 MG PO TABS: ORAL_TABLET | ORAL | 0 refills | Status: DC

## 2017-12-05 NOTE — Telephone Encounter (Signed)
Copied from CRM [id number]. Topic: Quick Communication - See Telephone Encounter >> Dec 05, 2017 11:04 AM Waymon Amato wrote: Pt states that it has been since 2016 but would like to discuss with Dr/ Alphonsus Sias or is assitant about getting a new rx about diazapam-suggested an appt but he would like a call back to discuss this first  Best number [phone]

## 2017-12-05 NOTE — Telephone Encounter (Signed)
Spoke to pt. His mother-in-law just passed away and his anxiety is pretty high. He has not had diazepam since 2014 according to Epic. He has just finished those from then. Asking for a little help during this time. CPE 07-25-17. [facility].

## 2017-12-05 NOTE — Telephone Encounter (Signed)
Let him know I am sorry for his loss. I did send a prescription in for him

## 2017-12-05 NOTE — Telephone Encounter (Signed)
Spoke to pt

## 2018-02-28 ENCOUNTER — Other Ambulatory Visit: Payer: Self-pay | Admitting: *Deleted

## 2018-02-28 MED ORDER — ALBUTEROL SULFATE HFA 108 (90 BASE) MCG/ACT IN AERS: 2.0000 | INHALATION_SPRAY | Freq: Four times a day (QID) | RESPIRATORY_TRACT | 1 refills | Status: DC | PRN

## 2018-02-28 NOTE — Telephone Encounter (Signed)
Have him set up this year's physical after June some time

## 2018-02-28 NOTE — Telephone Encounter (Signed)
Last Rx 01/19/2017. Last OV 07/2017-CPE

## 2018-07-11 ENCOUNTER — Ambulatory Visit (INDEPENDENT_AMBULATORY_CARE_PROVIDER_SITE_OTHER): Payer: 59 | Admitting: Family Medicine

## 2018-07-11 ENCOUNTER — Encounter: Payer: Self-pay | Admitting: Family Medicine

## 2018-07-11 ENCOUNTER — Ambulatory Visit (INDEPENDENT_AMBULATORY_CARE_PROVIDER_SITE_OTHER)
Admission: RE | Admit: 2018-07-11 | Discharge: 2018-07-11 | Disposition: A | Discharge status: Home / Self Care | Payer: 59 | Source: Ambulatory Visit | Attending: Family Medicine | Admitting: Family Medicine

## 2018-07-11 ENCOUNTER — Other Ambulatory Visit: Payer: Self-pay

## 2018-07-11 VITALS — BP 158/80 | HR 99 | Temp 99.2°F | Ht 66.0 in | Wt 199.5 lb

## 2018-07-11 DIAGNOSIS — M87052 Idiopathic aseptic necrosis of left femur: Secondary | ICD-10-CM | POA: Diagnosis not present

## 2018-07-11 DIAGNOSIS — M25552 Pain in left hip: Secondary | ICD-10-CM | POA: Diagnosis not present

## 2018-07-11 DIAGNOSIS — G8929 Other chronic pain: Secondary | ICD-10-CM

## 2018-07-11 MED ORDER — ALBUTEROL SULFATE HFA 108 (90 BASE) MCG/ACT IN AERS: 2.0000 | INHALATION_SPRAY | Freq: Four times a day (QID) | RESPIRATORY_TRACT | 2 refills | Status: DC | PRN

## 2018-07-11 NOTE — Progress Notes (Signed)
Jenniah Bhavsar T. Axton Cihlar, MD Primary Care and Sports Medicine [facility] [address] [place], [id number] Phone: [phone]  FAX: [phone]  Donald Evans - 48 y.o. male  MRN [id number]  Date of Birth: [birth year]  Visit Date: 07/11/2018  PCP: Karie SchwalbeLetvak, Richard I, MD  Referred by: Karie SchwalbeLetvak, Richard I, MD  Chief Complaint  Patient presents with  . Hip Pain    Left x 1 year   Subjective:   Donald Evans is a 48 y.o. very pleasant male patient who presents with the following:  Left hip pain for about a year.   Jumped in the truck when his mother in law died.  Did a lot of driving.  After that, his evening.  Leg was painful.  Some pain in the groin.  He is having quite a bit of functional limitation in his left hip.  He is having difficulty putting on his socks.  Having difficult difficulty going up and down stairs.  He is primary pain is in the groin as well as in the posterior aspect of the hip.  He is currently not having any back pain.  Is not having any numbness or tingling or radicular symptoms.  Over-the-counter treatments and over-the-counter medicines including anti-inflammatories Tylenol and heat have not helped at all.  ROM hurts.   Heat, Goodys.  Past Medical History, Surgical History, Social History, Family History, Problem List, Medications, and Allergies have been reviewed and updated if relevant.  Patient Active Problem List   Diagnosis Date Noted  . Hyperlipidemia   . Hypertension   . Routine general medical examination at a health care facility 01/30/2011  . Mild intermittent asthma 11/06/2010  . ALLERGIC RHINITIS, CHRONIC 09/01/2009  . GERD 08/31/2006    Past Medical History:  Diagnosis Date  . Allergy   . GERD (gastroesophageal reflux disease)   . Gout    on HCTZ  . Hyperlipidemia   . Hypertension   . Sarcoidosis     Stage 1 sarcoidosis - at age 48.  Spontaneous remission    History reviewed. No  pertinent surgical history.  Social History   Socioeconomic History  . Marital status: Single    Spouse name: Not on file  . Number of children: 1  . Years of education: Not on file  . Highest education level: Not on file  Occupational History  . Occupation: Works on wells/ septic    Employer: [employer]  Social Needs  . Financial resource strain: Not on file  . Food insecurity:    Worry: Not on file    Inability: Not on file  . Transportation needs:    Medical: Not on file    Non-medical: Not on file  Tobacco Use  . Smoking status: Former Smoker    Packs/day: 1.00    Years: 10.00    Pack years: 10.00    Types: Cigarettes    Last attempt to quit: 11/03/2004    Years since quitting: 13.6  . Smokeless tobacco: Never Used  Substance and Sexual Activity  . Alcohol use: Yes    Comment: Occasional to regular  . Drug use: No  . Sexual activity: Not on file  Lifestyle  . Physical activity:    Days per week: Not on file    Minutes per session: Not on file  . Stress: Not on file  Relationships  . Social connections:    Talks on phone: Not on file  Gets together: Not on file    Attends religious service: Not on file    Active member of club or organization: Not on file    Attends meetings of clubs or organizations: Not on file    Relationship status: Not on file  . Intimate partner violence:    Fear of current or ex partner: Not on file    Emotionally abused: Not on file    Physically abused: Not on file    Forced sexual activity: Not on file  Other Topics Concern  . Not on file  Social History Narrative  . Not on file    History reviewed. No pertinent family history.  Allergies  Allergen Reactions  . Hctz [Hydrochlorothiazide]     Gout attack    Medication list reviewed and updated in full in [facility].  GEN: No fevers, chills. Nontoxic. Primarily MSK c/o today. MSK: Detailed in the HPI GI: tolerating PO intake without difficulty  Neuro: No numbness, parasthesias, or tingling associated. Otherwise the pertinent positives of the ROS are noted above.   Objective:   BP (!) 158/80   Pulse 99   Temp 99.2 F (37.3 C) (Oral)   Ht  (1.676 m)   Wt 199 lb 8 oz (90.5 kg)   BMI 32.20 kg/m    GEN: WDWN, NAD, Non-toxic, Alert & Oriented x 3 HEENT: Atraumatic, Normocephalic.  Ears and Nose: No external deformity. EXTR: No clubbing/cyanosis/edema NEURO: TREN[place]LENBURG GAIT PSYCH: Normally interactive. Conversant. Not depressed or anxious appearing.  Calm demeanor.   HIP EXAM: SIDE: left ROM: Abduction, Flexion, Internal and External range of motion: Moderately limited abduction.  With the hip flexed to 90 degrees the patient only has about 10 degrees of rotational capacity. Pain with terminal IROM and EROM: Yes GTB: NT SLR: NEG Knees: No effusion FABER: UNABLE TO COMPLETE REVERSE FABER: NT, neg Piriformis: NT at direct palpation Str: flexion: 5/5 abduction: 5/5 adduction: 5/5 Strength testing non-tender     Radiology: Dg Hip Unilat W Or W/o Pelvis 2-3 Views Left  Result Date: 07/11/2018 CLINICAL DATA:  Pain and limitation of motion EXAM: DG HIP (WITH OR WITHOUT PELVIS) 2-3V LEFT COMPARISON:  None. FINDINGS: Weightbearing frontal pelvis as well as weightbearing frontal left hip and lateral left hip images were obtained. There is extensive avascular necrosis involving the left femoral head with flattening along the femoral head superiorly, best appreciated on the lateral view. There is no acute fracture or dislocation. There is no appreciable joint space narrowing. IMPRESSION: Extensive changes of avascular necrosis in the left femoral head with flattening along the superior aspect of the femoral head. No fracture or dislocation.  No appreciable joint space narrowing. Electronically Signed   By: Bretta Bang III M.D.   On: 07/11/2018 10:47    Assessment and Plan:   Avascular necrosis of bone of hip, left  (HCC) - Plan: Ambulatory referral to Orthopedic Surgery  Chronic left hip pain - Plan: DG Hip Unilat W OR W/O Pelvis 2-3 Views Left, Ambulatory referral to Orthopedic Surgery  Patient's left hip x-ray is notable for left-sided avascular necrosis of the femoral head.  We had a long conversation about this condition, the relevant anatomy, and I reviewed his x-rays face-to-face with him, and we reviewed disease progression.  The patient will be referred to a total joint surgeon for consultation, with probable definitive management of total hip arthroplasty.  I have asked either Dr. Merlyn Albert or Dr. Charlann Boxer to evaluate the patient, and I  appreciate their care  Meds ordered this encounter  Medications  . albuterol (PROAIR HFA) 108 (90 Base) MCG/ACT inhaler    Sig: Inhale 2 puffs into the lungs every 6 (six) hours as needed for wheezing or shortness of breath.    Dispense:  1 Inhaler    Refill:  2   Orders Placed This Encounter  Procedures  . DG Hip Unilat W OR W/O Pelvis 2-3 Views Left  . Ambulatory referral to Orthopedic Surgery    Signed,  Karleen Hampshire T. Worth Kober, MD   Outpatient Encounter Medications as of 07/11/2018  Medication Sig  . albuterol (PROAIR HFA) 108 (90 Base) MCG/ACT inhaler Inhale 2 puffs into the lungs every 6 (six) hours as needed for wheezing or shortness of breath.  Marland Kitchen amLODipine (NORVASC) 5 MG tablet Take 5 mg by mouth daily.  . diazepam (VALIUM) 5 MG tablet TAKE ONE TABLET THREE TIMES A DAY AS NEEDED FOR ANXIETY  . losartan (COZAAR) 50 MG tablet Take 50 mg by mouth daily.  . [DISCONTINUED] albuterol (PROAIR HFA) 108 (90 Base) MCG/ACT inhaler Inhale 2 puffs into the lungs every 6 (six) hours as needed for wheezing or shortness of breath.  . [DISCONTINUED] losartan (COZAAR) 100 MG tablet Take 1 tablet (100 mg total) by mouth daily.   No facility-administered encounter medications on file as of 07/11/2018.

## 2018-07-11 NOTE — Patient Instructions (Signed)
Our office will call you with an appointment with Dr. Merlyn Albert or Dr. Charlann Boxer at [facility]. ([facility])

## 2018-07-28 ENCOUNTER — Other Ambulatory Visit: Payer: Self-pay | Admitting: Internal Medicine

## 2018-07-31 ENCOUNTER — Encounter: Payer: Self-pay | Admitting: Internal Medicine

## 2018-07-31 ENCOUNTER — Other Ambulatory Visit: Payer: Self-pay | Admitting: Internal Medicine

## 2018-08-09 ENCOUNTER — Telehealth: Payer: Self-pay | Admitting: Internal Medicine

## 2018-08-09 NOTE — Telephone Encounter (Signed)
I left a message on patient's voice mail to return my call.  Patient needs clearance for surgery by Dr.Letvak.  I called to schedule patient's appointment. Dr.Letvak said it can count as patient's physical.  I found an opening on 08/26/18 at 10:45.

## 2018-08-26 ENCOUNTER — Encounter: Payer: Self-pay | Admitting: Internal Medicine

## 2018-08-26 ENCOUNTER — Ambulatory Visit (INDEPENDENT_AMBULATORY_CARE_PROVIDER_SITE_OTHER): Payer: 59 | Admitting: Internal Medicine

## 2018-08-26 ENCOUNTER — Other Ambulatory Visit: Payer: Self-pay

## 2018-08-26 VITALS — BP 110/80 | HR 86 | Temp 98.4°F | Ht 65.75 in | Wt 194.0 lb

## 2018-08-26 DIAGNOSIS — Z0000 Encounter for general adult medical examination without abnormal findings: Secondary | ICD-10-CM | POA: Diagnosis not present

## 2018-08-26 DIAGNOSIS — J4520 Mild intermittent asthma, uncomplicated: Secondary | ICD-10-CM | POA: Diagnosis not present

## 2018-08-26 DIAGNOSIS — Z1211 Encounter for screening for malignant neoplasm of colon: Secondary | ICD-10-CM

## 2018-08-26 DIAGNOSIS — Z01818 Encounter for other preprocedural examination: Secondary | ICD-10-CM | POA: Diagnosis not present

## 2018-08-26 DIAGNOSIS — I10 Essential (primary) hypertension: Secondary | ICD-10-CM

## 2018-08-26 MED ORDER — DIAZEPAM 5 MG PO TABS: ORAL_TABLET | ORAL | 0 refills | Status: DC

## 2018-08-26 NOTE — Assessment & Plan Note (Signed)
Only with pollen exposure-rarely uses inhaler Will use the albuterol before surgery

## 2018-08-26 NOTE — Assessment & Plan Note (Signed)
Medically cleared EKG shows sinus at 76. Normal axis and intervals and no ischemic changes. No change sine 02/11/10

## 2018-08-26 NOTE — Progress Notes (Signed)
Subjective:    Patient ID: Donald Evans, male    DOB: 05/07/1970, 48 y.o.   MRN: [id number]  HPI Here for PE and preoperative clearance before left THR (planned by Dr Maureen Ralphs)  Has been working new job [employer] the job  No significant problems with his breathing Hasn't needed it since the allergy season  No problems with BP Walking more at new job--and more steps  Current Outpatient Medications on File Prior to Visit  Medication Sig Dispense Refill  . albuterol (PROAIR HFA) 108 (90 Base) MCG/ACT inhaler Inhale 2 puffs into the lungs every 6 (six) hours as needed for wheezing or shortness of breath. 1 Inhaler 2  . amLODipine (NORVASC) 5 MG tablet TAKE 1 TABLET BY MOUTH EVERY DAY 90 tablet 3  . diazepam (VALIUM) 5 MG tablet TAKE ONE TABLET THREE TIMES A DAY AS NEEDED FOR ANXIETY 20 tablet 0  . losartan (COZAAR) 50 MG tablet TAKE 1 TABLET BY MOUTH EVERY DAY 30 tablet 0  . meloxicam (MOBIC) 15 MG tablet Take 15 mg by mouth daily.     No current facility-administered medications on file prior to visit.     Allergies  Allergen Reactions  . Hctz [Hydrochlorothiazide]     Gout attack    Past Medical History:  Diagnosis Date  . Allergy   . GERD (gastroesophageal reflux disease)   . Gout    on HCTZ  . Hyperlipidemia   . Hypertension   . Sarcoidosis     Stage 1 sarcoidosis - at age 59.  Spontaneous remission    History reviewed. No pertinent surgical history.  History reviewed. No pertinent family history.  Social History   Socioeconomic History  . Marital status: Single    Spouse name: Not on file  . Number of children: 1  . Years of education: Not on file  . Highest education level: Not on file  Occupational History  . Occupation: Associate Professor: [employer]  . Financial resource strain: Not on file  . Food insecurity    Worry: Not on file    Inability: Not on file  . Transportation  needs    Medical: Not on file    Non-medical: Not on file  Tobacco Use  . Smoking status: Former Smoker    Packs/day: 1.00    Years: 10.00    Pack years: 10.00    Types: Cigarettes    Quit date: 11/03/2004    Years since quitting: 13.8  . Smokeless tobacco: Never Used  Substance and Sexual Activity  . Alcohol use: Yes    Comment: Occasional to regular  . Drug use: No  . Sexual activity: Not on file  Lifestyle  . Physical activity    Days per week: Not on file    Minutes per session: Not on file  . Stress: Not on file  Relationships  . Social Herbalist on phone: Not on file    Gets together: Not on file    Attends religious service: Not on file    Active member of club or organization: Not on file    Attends meetings of clubs or organizations: Not on file    Relationship status: Not on file  . Intimate partner violence    Fear of current or ex partner: Not on file    Emotionally abused: Not on file    Physically abused: Not on file  Forced sexual activity: Not on file  Other Topics Concern  . Not on file  Social History Narrative  . Not on file    Review of Systems  Constitutional: Negative for fatigue.       Has regained some of the weight he lost Wears seat belt  HENT: Negative for dental problem, hearing loss, tinnitus and trouble swallowing.        Keeps up with dentist  Eyes: Negative for visual disturbance.       No diplopia or unilateral vision loss  Respiratory: Negative for cough, chest tightness and shortness of breath.   Cardiovascular: Negative for chest pain, palpitations and leg swelling.  Gastrointestinal: Negative for abdominal pain, blood in stool and constipation.       No heartburn recently    Endocrine: Negative for polydipsia and polyuria.  Genitourinary: Negative for difficulty urinating and urgency.       No sexual problems  Musculoskeletal: Positive for arthralgias. Negative for back pain.  Skin: Negative for rash.   Allergic/Immunologic: Positive for environmental allergies. Negative for immunocompromised state.       Doesn't need antihistamines  Neurological: Negative for dizziness, syncope, light-headedness and headaches.  Hematological: Negative for adenopathy. Does not bruise/bleed easily.  Psychiatric/Behavioral: Negative for dysphoric mood and sleep disturbance.       Occ anxiety----rarely uses the diazepam       Objective:   Physical Exam  Constitutional: He is oriented to person, place, and time. He appears well-developed. No distress.  HENT:  Head: Normocephalic and atraumatic.  Right Ear: External ear normal.  Left Ear: External ear normal.  Mouth/Throat: Oropharynx is clear and moist. No oropharyngeal exudate.  Eyes: Pupils are equal, round, and reactive to light. Conjunctivae are normal.  Neck: No thyromegaly present.  Cardiovascular: Normal rate, regular rhythm, normal heart sounds and intact distal pulses. Exam reveals no gallop.  No murmur heard. Respiratory: Effort normal and breath sounds normal. No respiratory distress. He has no wheezes. He has no rales.  GI: Soft. There is no abdominal tenderness.  Musculoskeletal:        General: No tenderness or edema.  Lymphadenopathy:    He has no cervical adenopathy.  Neurological: He is alert and oriented to person, place, and time.  Skin: No rash noted. No erythema.  Psychiatric: He has a normal mood and affect. His behavior is normal.           Assessment & Plan:

## 2018-08-26 NOTE — Assessment & Plan Note (Signed)
Healthy No contraindications to surgery--should do well. Approval given Needs yearly flu vaccine Will set up for colonoscopy

## 2018-08-26 NOTE — Assessment & Plan Note (Signed)
BP Readings from Last 3 Encounters:  08/26/18 110/80  07/11/18 (!) 158/80  07/25/17 140/88   Good control Due for labs

## 2018-08-27 LAB — CBC
HCT: 39.3 % (ref 39.0–52.0)
Hemoglobin: 13.5 g/dL (ref 13.0–17.0)
MCHC: 34.4 g/dL (ref 30.0–36.0)
MCV: 86.7 fl (ref 78.0–100.0)
Platelets: 234 10*3/uL (ref 150.0–400.0)
RBC: 4.54 Mil/uL (ref 4.22–5.81)
RDW: 14 % (ref 11.5–15.5)
WBC: 7.2 10*3/uL (ref 4.0–10.5)

## 2018-08-27 LAB — COMPREHENSIVE METABOLIC PANEL
ALT: 19 U/L (ref 0–53)
AST: 13 U/L (ref 0–37)
Albumin: 4.6 g/dL (ref 3.5–5.2)
Alkaline Phosphatase: 51 U/L (ref 39–117)
BUN: 20 mg/dL (ref 6–23)
CO2: 24 mEq/L (ref 19–32)
Calcium: 9.3 mg/dL (ref 8.4–10.5)
Chloride: 105 mEq/L (ref 96–112)
Creatinine, Ser: 1.09 mg/dL (ref 0.40–1.50)
GFR: 71.52 mL/min (ref >60.00)
Glucose, Bld: 88 mg/dL (ref 70–99)
Potassium: 4 mEq/L (ref 3.5–5.1)
Sodium: 138 mEq/L (ref 135–145)
Total Bilirubin: 0.6 mg/dL (ref 0.2–1.2)
Total Protein: 6.8 g/dL (ref 6.0–8.3)

## 2018-08-29 ENCOUNTER — Telehealth: Payer: Self-pay | Admitting: Internal Medicine

## 2018-08-29 ENCOUNTER — Other Ambulatory Visit (INDEPENDENT_AMBULATORY_CARE_PROVIDER_SITE_OTHER): Payer: 59

## 2018-08-29 ENCOUNTER — Other Ambulatory Visit: Payer: Self-pay

## 2018-08-29 DIAGNOSIS — Z01818 Encounter for other preprocedural examination: Secondary | ICD-10-CM

## 2018-08-29 NOTE — Telephone Encounter (Signed)
Spouse called back checking on this

## 2018-08-29 NOTE — Telephone Encounter (Signed)
Appointment 7/9 Spouse aware

## 2018-08-29 NOTE — Telephone Encounter (Signed)
Best number [phone] Eustaquio Maize (spouse) called pt needs PT/INR for dr Wendall Mola office.  Pt called [facility] he needs appointment they are week out.  PT needs this done today he is having surgery Tuesday. Can this be done here? If so we need order in system

## 2018-08-29 NOTE — Telephone Encounter (Signed)
Protime order Set him up for lab draw today if he can get in

## 2018-08-30 LAB — PROTIME-INR
INR: 1.1 ratio — ABNORMAL HIGH (ref 0.8–1.0)
Prothrombin Time: 12.9 s (ref 9.6–13.1)

## 2018-09-02 ENCOUNTER — Other Ambulatory Visit: Payer: Self-pay | Admitting: Internal Medicine

## 2018-10-29 ENCOUNTER — Encounter: Payer: Self-pay | Admitting: Internal Medicine

## 2019-02-21 HISTORY — PX: TOTAL HIP ARTHROPLASTY: SHX124

## 2019-06-04 ENCOUNTER — Telehealth: Payer: Self-pay | Admitting: Internal Medicine

## 2019-06-04 MED ORDER — ALBUTEROL SULFATE HFA 108 (90 BASE) MCG/ACT IN AERS: 2.0000 | INHALATION_SPRAY | Freq: Four times a day (QID) | RESPIRATORY_TRACT | 0 refills | Status: DC | PRN

## 2019-06-04 NOTE — Telephone Encounter (Signed)
Rx would not allow me to put # of inhalers. I did 18 gm inhaler with a note to the pharmacy that pt wants 3 inhalers.

## 2019-06-04 NOTE — Telephone Encounter (Signed)
Refill of Ventolin HFA - pt requesting 90 day supply (3 inhalers)  Pt states that his insurance if about to stop paying for these and he is wanting to get some to have on hand since his allergies are increased right now.   [facility]

## 2019-08-25 ENCOUNTER — Other Ambulatory Visit: Payer: Self-pay | Admitting: Internal Medicine

## 2019-08-27 ENCOUNTER — Encounter: Payer: 59 | Admitting: Internal Medicine

## 2019-08-27 DIAGNOSIS — Z0289 Encounter for other administrative examinations: Secondary | ICD-10-CM

## 2019-09-29 ENCOUNTER — Other Ambulatory Visit: Payer: Self-pay | Admitting: Internal Medicine

## 2019-10-23 ENCOUNTER — Other Ambulatory Visit: Payer: Self-pay | Admitting: Internal Medicine

## 2019-11-25 ENCOUNTER — Other Ambulatory Visit: Payer: Self-pay | Admitting: Internal Medicine

## 2019-12-24 ENCOUNTER — Other Ambulatory Visit: Payer: Self-pay | Admitting: Internal Medicine

## 2020-01-28 ENCOUNTER — Other Ambulatory Visit: Payer: Self-pay | Admitting: Internal Medicine

## 2020-02-19 ENCOUNTER — Other Ambulatory Visit: Payer: Self-pay | Admitting: Internal Medicine

## 2020-03-06 ENCOUNTER — Other Ambulatory Visit: Payer: Self-pay | Admitting: Internal Medicine

## 2020-03-15 ENCOUNTER — Telehealth: Payer: Self-pay | Admitting: *Deleted

## 2020-03-15 MED ORDER — ALBUTEROL SULFATE HFA 108 (90 BASE) MCG/ACT IN AERS: 2.0000 | INHALATION_SPRAY | Freq: Four times a day (QID) | RESPIRATORY_TRACT | 0 refills | Status: DC | PRN

## 2020-03-15 NOTE — Telephone Encounter (Signed)
Patient left a voicemail requesting a refill on his inhaler. [facility]

## 2020-03-31 ENCOUNTER — Telehealth (INDEPENDENT_AMBULATORY_CARE_PROVIDER_SITE_OTHER): Payer: 59 | Admitting: Family Medicine

## 2020-03-31 ENCOUNTER — Encounter: Payer: Self-pay | Admitting: Family Medicine

## 2020-03-31 DIAGNOSIS — J4520 Mild intermittent asthma, uncomplicated: Secondary | ICD-10-CM | POA: Diagnosis not present

## 2020-03-31 DIAGNOSIS — J069 Acute upper respiratory infection, unspecified: Secondary | ICD-10-CM

## 2020-03-31 MED ORDER — VENTOLIN HFA 108 (90 BASE) MCG/ACT IN AERS: 2.0000 | INHALATION_SPRAY | RESPIRATORY_TRACT | 0 refills | Status: DC | PRN

## 2020-03-31 NOTE — Progress Notes (Signed)
Subjective:    Patient ID: Donald Evans, male    DOB: 1970-06-21, 50 y.o.   MRN: [id number]  HPI Virtual Visit via Video Note  I connected with the patient on 03/31/20 at  3:15 PM EST by a video enabled telemedicine application and verified that I am speaking with the correct person using two identifiers.  Location patient: home Location provider:work or home office Persons participating in the virtual visit: patient, provider  I discussed the limitations of evaluation and management by telemedicine and the availability of in person appointments. The patient expressed understanding and agreed to proceed.   HPI: Here for 4 days of mild headache, PND, and a dry cough. No fever or chest pain or SOB. No NVD or body aches. He gets tested for the Covid-19 virus every Tuesday for his work and he has been negative so far. He is drinking fluids and taking Zyrtec 5 mg daily. He has been taking Mucinex BID and he is using an albuterol inhaler. This inhaler is generic, and it does not work as well as name brand Ventolin HFA.    ROS: See pertinent positives and negatives per HPI.  Past Medical History:  Diagnosis Date  . Allergy   . GERD (gastroesophageal reflux disease)   . Gout    on HCTZ  . Hyperlipidemia   . Hypertension   . Sarcoidosis     Stage 1 sarcoidosis - at age 24.  Spontaneous remission    History reviewed. No pertinent surgical history.  History reviewed. No pertinent family history.   Current Outpatient Medications:  .  amLODipine (NORVASC) 5 MG tablet, TAKE 1 TABLET BY MOUTH EVERY DAY, Disp: 90 tablet, Rfl: 3 .  cetirizine (ZYRTEC) 5 MG tablet, Take 5 mg by mouth daily., Disp: , Rfl:  .  diazepam (VALIUM) 5 MG tablet, TAKE ONE TABLET THREE TIMES A DAY AS NEEDED FOR ANXIETY, Disp: 20 tablet, Rfl: 0 .  losartan (COZAAR) 50 MG tablet, TAKE 1 TABLET (50 MG TOTAL) BY MOUTH DAILY. MUST SCHEDULE OFFICE VISIT, Disp: 30 tablet, Rfl: 0 .  VENTOLIN HFA 108 (90 Base) MCG/ACT  inhaler, Inhale 2 puffs into the lungs every 4 (four) hours as needed for wheezing or shortness of breath., Disp: 18 g, Rfl: 0 .  meloxicam (MOBIC) 15 MG tablet, Take 15 mg by mouth daily. (Patient not taking: Reported on 03/31/2020), Disp: , Rfl:   EXAM:  VITALS per patient if applicable:  GENERAL: alert, oriented, appears well and in no acute distress  HEENT: atraumatic, conjunttiva clear, no obvious abnormalities on inspection of external nose and ears  NECK: normal movements of the head and neck  LUNGS: on inspection no signs of respiratory distress, breathing rate appears normal, no obvious gross SOB, gasping or wheezing  CV: no obvious cyanosis  MS: moves all visible extremities without noticeable abnormality  PSYCH/NEURO: pleasant and cooperative, no obvious depression or anxiety, speech and thought processing grossly intact  ASSESSMENT AND PLAN: Viral URI. We will write for him to get a name brand Ventolin HFA inhaler to use prn. I advised him to increase the Zyrtec to 10 mg daily. Recheck prn. Gershon Crane, MD   Discussed the following assessment and plan:  No diagnosis found.     I discussed the assessment and treatment plan with the patient. The patient was provided an opportunity to ask questions and all were answered. The patient agreed with the plan and demonstrated an understanding of the instructions.   The patient  was advised to call back or seek an in-person evaluation if the symptoms worsen or if the condition fails to improve as anticipated.     Review of Systems     Objective:   Physical Exam        Assessment & Plan:

## 2020-04-13 ENCOUNTER — Telehealth: Payer: Self-pay | Admitting: Internal Medicine

## 2020-04-14 NOTE — Telephone Encounter (Signed)
Please send valsartan 160mg   #90 x 0 He needs appt within the 3 months

## 2020-04-14 NOTE — Telephone Encounter (Signed)
Pharmacy comment: Product Backordered/Unavailable:ALL STRENGTHS ON BACKORDER.  Pt has not been seen since 08/2018.

## 2020-04-14 NOTE — Telephone Encounter (Signed)
Rx sent electronically. Will forward to [place] to set up appt.

## 2020-04-14 NOTE — Telephone Encounter (Signed)
I left a message on patient's voice mail to return my call.  Patient needs to schedule an appointment with Dr.Letvak within the next 3 months.

## 2020-04-15 NOTE — Telephone Encounter (Signed)
I tried to reach patient several times to schedule appointment.  Patient doesn't answer and his phone number is constantly busy.  I did leave message for patient to call back and schedule appointment.

## 2020-04-24 ENCOUNTER — Other Ambulatory Visit: Payer: Self-pay | Admitting: Family Medicine

## 2020-06-15 ENCOUNTER — Telehealth: Payer: Self-pay

## 2020-06-15 MED ORDER — LOSARTAN POTASSIUM 50 MG PO TABS: 50.0000 mg | ORAL_TABLET | Freq: Every day | ORAL | 3 refills | Status: DC

## 2020-06-15 NOTE — Telephone Encounter (Signed)
Pt said he has been taking amlodipine 5 mg  Po daily and losartan 50 mg taking one tablet po daily. Pt ran out of losartan 50 mg on 06/12/20. Pt said he [facility]ill [facility] and pt said he was advised by [facility] that pt has amlodipine and valsartan on current med list. Pt does not remember changing to valsartan; I advised there was a period of time when losartan was not available. I spoke with Caryn Bee pharmacist at [facility]; Caryn Bee said that pt never picked up the valsartan that was sent to pharmacy on 04/14/20; see phone note 04/13/20. Caryn Bee said the losartan 50 mg is available in their pharmacy now. Sending note to Dr Alphonsus Sias to see if wants pt to pick up valsartan 160 mg rx or change back to Losartan 50 mg taking one daily. Do not see upcoming appt scheduled for pt.Please advise. Pt request cb when done.

## 2020-06-15 NOTE — Telephone Encounter (Signed)
Change back to the losartan--- 1 year Rx Set up for PE whenever he is due

## 2020-06-15 NOTE — Telephone Encounter (Signed)
[facility] Called pt and schedule CPE 07-20-20.

## 2020-07-20 ENCOUNTER — Other Ambulatory Visit: Payer: Self-pay

## 2020-07-20 ENCOUNTER — Encounter: Payer: Self-pay | Admitting: Internal Medicine

## 2020-07-20 ENCOUNTER — Other Ambulatory Visit: Payer: Self-pay | Admitting: Internal Medicine

## 2020-07-20 ENCOUNTER — Ambulatory Visit (INDEPENDENT_AMBULATORY_CARE_PROVIDER_SITE_OTHER): Payer: 59 | Admitting: Internal Medicine

## 2020-07-20 VITALS — BP 130/86 | HR 97 | Temp 98.4°F | Ht 66.0 in | Wt 211.0 lb

## 2020-07-20 DIAGNOSIS — Z0000 Encounter for general adult medical examination without abnormal findings: Secondary | ICD-10-CM

## 2020-07-20 DIAGNOSIS — Z0001 Encounter for general adult medical examination with abnormal findings: Secondary | ICD-10-CM | POA: Diagnosis not present

## 2020-07-20 DIAGNOSIS — I10 Essential (primary) hypertension: Secondary | ICD-10-CM | POA: Diagnosis not present

## 2020-07-20 DIAGNOSIS — J4520 Mild intermittent asthma, uncomplicated: Secondary | ICD-10-CM

## 2020-07-20 DIAGNOSIS — L570 Actinic keratosis: Secondary | ICD-10-CM | POA: Diagnosis not present

## 2020-07-20 DIAGNOSIS — Z1211 Encounter for screening for malignant neoplasm of colon: Secondary | ICD-10-CM

## 2020-07-20 LAB — COMPREHENSIVE METABOLIC PANEL
ALT: 26 U/L (ref 0–53)
AST: 15 U/L (ref 0–37)
Albumin: 4.1 g/dL (ref 3.5–5.2)
Alkaline Phosphatase: 48 U/L (ref 39–117)
BUN: 15 mg/dL (ref 6–23)
CO2: 25 mEq/L (ref 19–32)
Calcium: 8.7 mg/dL (ref 8.4–10.5)
Chloride: 105 mEq/L (ref 96–112)
Creatinine, Ser: 1.17 mg/dL (ref 0.40–1.50)
GFR: 71.83 mL/min (ref >60.00)
Glucose, Bld: 110 mg/dL — ABNORMAL HIGH (ref 70–99)
Potassium: 3.9 mEq/L (ref 3.5–5.1)
Sodium: 140 mEq/L (ref 135–145)
Total Bilirubin: 0.5 mg/dL (ref 0.2–1.2)
Total Protein: 6.4 g/dL (ref 6.0–8.3)

## 2020-07-20 LAB — CBC
HCT: 40.8 % (ref 39.0–52.0)
Hemoglobin: 14 g/dL (ref 13.0–17.0)
MCHC: 34.2 g/dL (ref 30.0–36.0)
MCV: 86.8 fl (ref 78.0–100.0)
Platelets: 209 10*3/uL (ref 150.0–400.0)
RBC: 4.7 Mil/uL (ref 4.22–5.81)
RDW: 14.7 % (ref 11.5–15.5)
WBC: 7.4 10*3/uL (ref 4.0–10.5)

## 2020-07-20 MED ORDER — ALBUTEROL SULFATE HFA 108 (90 BASE) MCG/ACT IN AERS: 2.0000 | INHALATION_SPRAY | RESPIRATORY_TRACT | 1 refills | Status: DC | PRN

## 2020-07-20 MED ORDER — VENTOLIN HFA 108 (90 BASE) MCG/ACT IN AERS: 2.0000 | INHALATION_SPRAY | RESPIRATORY_TRACT | 0 refills | Status: DC | PRN

## 2020-07-20 MED ORDER — AMLODIPINE BESYLATE 5 MG PO TABS: 1.0000 | ORAL_TABLET | Freq: Every day | ORAL | 3 refills | Status: DC

## 2020-07-20 NOTE — Assessment & Plan Note (Signed)
BP Readings from Last 3 Encounters:  07/20/20 130/86  08/26/18 110/80  07/11/18 (!) 158/80   Good control on losartan and amlodipine

## 2020-07-20 NOTE — Progress Notes (Signed)
Subjective:    Patient ID: Donald Evans, male    DOB: 1970-04-01, 50 y.o.   MRN: [id number]  HPI Here for physical This visit occurred during the SARS-CoV-2 public health emergency.  Safety protocols were in place, including screening questions prior to the visit, additional usage of staff PPE, and extensive cleaning of exam room while observing appropriate contact time as indicated for disinfecting solutions.   THR went well Did gain ~15# since then Not as physically active at work Has rowing machine--just has to use it  Mild asthma symptoms with pollen and humidity Uses inhaler then with success  Current Outpatient Medications on File Prior to Visit  Medication Sig Dispense Refill  . amLODipine (NORVASC) 5 MG tablet TAKE 1 TABLET BY MOUTH EVERY DAY 90 tablet 3  . cetirizine (ZYRTEC) 10 MG tablet Take 10 mg by mouth daily.    . diazepam (VALIUM) 5 MG tablet TAKE ONE TABLET THREE TIMES A DAY AS NEEDED FOR ANXIETY 20 tablet 0  . losartan (COZAAR) 50 MG tablet Take 1 tablet (50 mg total) by mouth daily. 90 tablet 3  . VENTOLIN HFA 108 (90 Base) MCG/ACT inhaler INHALE 2 PUFFS INTO THE LUNGS EVERY 4 HOURS AS NEEDED FOR WHEEZING OR SHORTNESS OF BREATH. 18 each 0   No current facility-administered medications on file prior to visit.    Allergies  Allergen Reactions  . Hctz [Hydrochlorothiazide]     Gout attack    Past Medical History:  Diagnosis Date  . Allergy   . GERD (gastroesophageal reflux disease)   . Gout    on HCTZ  . Hyperlipidemia   . Hypertension   . 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 TOTAL HIP ARTHROPLASTY Left 2021   [place]    History reviewed. No pertinent family history.  Social History   Socioeconomic History  . Marital status: Married    Spouse name: Not on file  . Number of children: 1  . Years of education: Not on file  . Highest education level:  Not on file  Occupational History  . Occupation: Radiation protection practitioner: [employer]  Tobacco Use  . Smoking status: Former Smoker    Packs/day: 1.00    Years: 10.00    Pack years: 10.00    Types: Cigarettes    Quit date: 11/03/2004    Years since quitting: 15.7  . Smokeless tobacco: Never Used  Substance and Sexual Activity  . Alcohol use: Yes    Comment: Occasional to regular  . Drug use: No  . Sexual activity: Not on file  Other Topics Concern  . Not on file  Social History Narrative  . Not on file   Social Determinants of Health   Financial Resource Strain: Not on file  Food Insecurity: Not on file  Transportation Needs: Not on file  Physical Activity: Not on file  Stress: Not on file  Social Connections: Not on file  Intimate Partner Violence: Not on file   Review of Systems  Constitutional: Positive for unexpected weight change. Negative for fatigue.       Wears seat belt  HENT: Negative for dental problem, hearing loss and tinnitus.        Keeps up with dentist  Eyes: Negative for visual disturbance.       No diplopia or unilateral vision loss Needs reading glasses  Respiratory: Negative for chest tightness.        Occ cough and wheezing with asthma  Gastrointestinal: Negative for abdominal pain, blood in stool and constipation.       Occ heartburn--no meds  Endocrine: Negative for polydipsia and polyuria.  Genitourinary: Negative for difficulty urinating and urgency.       No sexual problems  Musculoskeletal: Negative for arthralgias, back pain and joint swelling.  Skin:       Has spot on vertex to be checked--no pain  Allergic/Immunologic: Positive for environmental allergies. Negative for immunocompromised state.       Uses cetirizine  Neurological: Negative for dizziness, syncope, light-headedness and headaches.  Hematological: Negative for adenopathy. Does not bruise/bleed easily.  Psychiatric/Behavioral: Negative for dysphoric mood and  sleep disturbance. The patient is not nervous/anxious.        Objective:   Physical Exam Constitutional:      Appearance: Normal appearance.  HENT:     Right Ear: Tympanic membrane and ear canal normal.     Left Ear: Tympanic membrane and ear canal normal.     Mouth/Throat:     Pharynx: No oropharyngeal exudate or posterior oropharyngeal erythema.  Eyes:     Conjunctiva/sclera: Conjunctivae normal.     Pupils: Pupils are equal, round, and reactive to light.  Cardiovascular:     Rate and Rhythm: Normal rate and regular rhythm.     Pulses: Normal pulses.     Heart sounds: No murmur heard. No gallop.   Pulmonary:     Effort: Pulmonary effort is normal.     Breath sounds: Normal breath sounds. No wheezing or rales.  Abdominal:     Palpations: Abdomen is soft.     Tenderness: There is no abdominal tenderness.  Musculoskeletal:     Cervical back: Neck supple.     Right lower leg: No edema.     Left lower leg: No edema.  Lymphadenopathy:     Cervical: No cervical adenopathy.  Skin:    General: Skin is warm.     Findings: No rash.     Comments: 4 small actinics on scalp  Neurological:     General: No focal deficit present.     Mental Status: He is alert and oriented to person, place, and time.  Psychiatric:        Mood and Affect: Mood normal.        Behavior: Behavior normal.            Assessment & Plan:

## 2020-07-20 NOTE — Assessment & Plan Note (Signed)
Okay with the albuterol prn

## 2020-07-20 NOTE — Assessment & Plan Note (Signed)
Healthy but let himself go---discussed fitness and lifestyle Will try again to set up colonoscopy Consider PSA at 53 Recommended shingrix and flu vaccines--he will consider Needs COVID booster

## 2020-07-20 NOTE — Patient Instructions (Signed)
Look into the shingrix vaccine

## 2020-07-20 NOTE — Assessment & Plan Note (Signed)
Discussed treatment alternatives Verbal consent 4 lesions treated with liquid nitrogen 30 seconds x 2 Tolerated well Discussed aftercare Recommended establishing with derm (and sunblock)

## 2020-09-12 ENCOUNTER — Inpatient Hospital Stay (HOSPITAL_COMMUNITY)
Admission: EM | Admit: 2020-09-12 | Discharge: 2020-09-14 | DRG: 202 | Disposition: A | Discharge status: Home / Self Care | Payer: 59 | Attending: Internal Medicine | Admitting: Internal Medicine

## 2020-09-12 ENCOUNTER — Encounter (HOSPITAL_COMMUNITY): Payer: Self-pay | Admitting: Emergency Medicine

## 2020-09-12 ENCOUNTER — Emergency Department (HOSPITAL_COMMUNITY): Payer: 59

## 2020-09-12 ENCOUNTER — Other Ambulatory Visit: Payer: Self-pay

## 2020-09-12 DIAGNOSIS — J4541 Moderate persistent asthma with (acute) exacerbation: Secondary | ICD-10-CM | POA: Diagnosis not present

## 2020-09-12 DIAGNOSIS — J45901 Unspecified asthma with (acute) exacerbation: Secondary | ICD-10-CM | POA: Diagnosis not present

## 2020-09-12 DIAGNOSIS — Z716 Tobacco abuse counseling: Secondary | ICD-10-CM

## 2020-09-12 DIAGNOSIS — Z20822 Contact with and (suspected) exposure to covid-19: Secondary | ICD-10-CM | POA: Diagnosis present

## 2020-09-12 DIAGNOSIS — K219 Gastro-esophageal reflux disease without esophagitis: Secondary | ICD-10-CM | POA: Diagnosis present

## 2020-09-12 DIAGNOSIS — Z96642 Presence of left artificial hip joint: Secondary | ICD-10-CM | POA: Diagnosis present

## 2020-09-12 DIAGNOSIS — Z79899 Other long term (current) drug therapy: Secondary | ICD-10-CM

## 2020-09-12 DIAGNOSIS — R0602 Shortness of breath: Secondary | ICD-10-CM | POA: Diagnosis not present

## 2020-09-12 DIAGNOSIS — Z888 Allergy status to other drugs, medicaments and biological substances status: Secondary | ICD-10-CM

## 2020-09-12 DIAGNOSIS — I10 Essential (primary) hypertension: Secondary | ICD-10-CM | POA: Diagnosis present

## 2020-09-12 DIAGNOSIS — J9601 Acute respiratory failure with hypoxia: Secondary | ICD-10-CM | POA: Diagnosis present

## 2020-09-12 DIAGNOSIS — D869 Sarcoidosis, unspecified: Secondary | ICD-10-CM | POA: Diagnosis present

## 2020-09-12 DIAGNOSIS — E785 Hyperlipidemia, unspecified: Secondary | ICD-10-CM | POA: Diagnosis present

## 2020-09-12 DIAGNOSIS — M549 Dorsalgia, unspecified: Secondary | ICD-10-CM | POA: Diagnosis present

## 2020-09-12 DIAGNOSIS — M1A9XX0 Chronic gout, unspecified, without tophus (tophi): Secondary | ICD-10-CM | POA: Diagnosis present

## 2020-09-12 DIAGNOSIS — J4520 Mild intermittent asthma, uncomplicated: Secondary | ICD-10-CM | POA: Diagnosis present

## 2020-09-12 DIAGNOSIS — Z720 Tobacco use: Secondary | ICD-10-CM

## 2020-09-12 DIAGNOSIS — J4540 Moderate persistent asthma, uncomplicated: Secondary | ICD-10-CM | POA: Diagnosis present

## 2020-09-12 LAB — COMPREHENSIVE METABOLIC PANEL
ALT: 38 U/L (ref 0–44)
AST: 19 U/L (ref 15–41)
Albumin: 3.3 g/dL — ABNORMAL LOW (ref 3.5–5.0)
Alkaline Phosphatase: 48 U/L (ref 38–126)
Anion gap: 8 (ref 5–15)
BUN: 18 mg/dL (ref 6–20)
CO2: 23 mmol/L (ref 22–32)
Calcium: 8.6 mg/dL — ABNORMAL LOW (ref 8.9–10.3)
Chloride: 103 mmol/L (ref 98–111)
Creatinine, Ser: 1.18 mg/dL (ref 0.61–1.24)
GFR, Estimated: >60 mL/min (ref >60)
Glucose, Bld: 115 mg/dL — ABNORMAL HIGH (ref 70–99)
Potassium: 3.8 mmol/L (ref 3.5–5.1)
Sodium: 134 mmol/L — ABNORMAL LOW (ref 135–145)
Total Bilirubin: 1 mg/dL (ref 0.3–1.2)
Total Protein: 6.4 g/dL — ABNORMAL LOW (ref 6.5–8.1)

## 2020-09-12 LAB — CBC WITH DIFFERENTIAL/PLATELET
Abs Immature Granulocytes: 0.04 10*3/uL (ref 0.00–0.07)
Basophils Absolute: 0 10*3/uL (ref 0.0–0.1)
Basophils Relative: 0 %
Eosinophils Absolute: 0 10*3/uL (ref 0.0–0.5)
Eosinophils Relative: 0 %
HCT: 46.2 % (ref 39.0–52.0)
Hemoglobin: 15.2 g/dL (ref 13.0–17.0)
Immature Granulocytes: 0 %
Lymphocytes Relative: 11 %
Lymphs Abs: 1.1 10*3/uL (ref 0.7–4.0)
MCH: 29.9 pg (ref 26.0–34.0)
MCHC: 32.9 g/dL (ref 30.0–36.0)
MCV: 90.8 fL (ref 80.0–100.0)
Monocytes Absolute: 1.8 10*3/uL — ABNORMAL HIGH (ref 0.1–1.0)
Monocytes Relative: 19 %
Neutro Abs: 6.5 10*3/uL (ref 1.7–7.7)
Neutrophils Relative %: 70 %
Platelets: 233 10*3/uL (ref 150–400)
RBC: 5.09 MIL/uL (ref 4.22–5.81)
RDW: 14.7 % (ref 11.5–15.5)
WBC: 9.4 10*3/uL (ref 4.0–10.5)
nRBC: 0.2 % (ref 0.0–0.2)

## 2020-09-12 LAB — PROTIME-INR
INR: 1 (ref 0.8–1.2)
Prothrombin Time: 13.2 seconds (ref 11.4–15.2)

## 2020-09-12 LAB — HIV ANTIBODY (ROUTINE TESTING W REFLEX): HIV Screen 4th Generation wRfx: NONREACTIVE

## 2020-09-12 LAB — RESP PANEL BY RT-PCR (FLU A&B, COVID) ARPGX2
Influenza A by PCR: NEGATIVE
Influenza B by PCR: NEGATIVE
SARS Coronavirus 2 by RT PCR: NEGATIVE

## 2020-09-12 LAB — LACTIC ACID, PLASMA
Lactic Acid, Venous: 1 mmol/L (ref 0.5–1.9)
Lactic Acid, Venous: 1.5 mmol/L (ref 0.5–1.9)

## 2020-09-12 LAB — APTT: aPTT: 22 seconds — ABNORMAL LOW (ref 24–36)

## 2020-09-12 MED ORDER — ALBUTEROL SULFATE (2.5 MG/3ML) 0.083% IN NEBU
2.5000 mg | INHALATION_SOLUTION | Freq: Four times a day (QID) | RESPIRATORY_TRACT | Status: DC | PRN
  Administered 2020-09-12: 2.5 mg via RESPIRATORY_TRACT
  Filled 2020-09-12: qty 3

## 2020-09-12 MED ORDER — ACETAMINOPHEN 650 MG RE SUPP: 650.0000 mg | Freq: Four times a day (QID) | RECTAL | Status: DC | PRN

## 2020-09-12 MED ORDER — ACETAMINOPHEN 325 MG PO TABS
650.0000 mg | ORAL_TABLET | Freq: Four times a day (QID) | ORAL | Status: DC | PRN
  Administered 2020-09-13: 650 mg via ORAL
  Filled 2020-09-12: qty 2

## 2020-09-12 MED ORDER — METHYLPREDNISOLONE SODIUM SUCC 125 MG IJ SOLR
125.0000 mg | Freq: Once | INTRAMUSCULAR | Status: AC
  Administered 2020-09-12: 125 mg via INTRAVENOUS
  Filled 2020-09-12: qty 2

## 2020-09-12 MED ORDER — IPRATROPIUM BROMIDE 0.02 % IN SOLN
0.5000 mg | Freq: Once | RESPIRATORY_TRACT | Status: AC
  Administered 2020-09-12: 0.5 mg via RESPIRATORY_TRACT
  Filled 2020-09-12: qty 2.5

## 2020-09-12 MED ORDER — ALBUTEROL SULFATE HFA 108 (90 BASE) MCG/ACT IN AERS
6.0000 | INHALATION_SPRAY | Freq: Once | RESPIRATORY_TRACT | Status: AC
  Administered 2020-09-12: 6 via RESPIRATORY_TRACT
  Filled 2020-09-12: qty 6.7

## 2020-09-12 MED ORDER — GUAIFENESIN-DM 100-10 MG/5ML PO SYRP
5.0000 mL | ORAL_SOLUTION | ORAL | Status: DC | PRN
  Administered 2020-09-13 – 2020-09-14 (×2): 5 mL via ORAL
  Filled 2020-09-12 (×2): qty 5

## 2020-09-12 MED ORDER — SODIUM CHLORIDE 0.9 % IV SOLN: 250.0000 mL | INTRAVENOUS | Status: DC | PRN

## 2020-09-12 MED ORDER — SODIUM CHLORIDE 0.9% FLUSH
3.0000 mL | INTRAVENOUS | Status: DC | PRN
  Administered 2020-09-13: 3 mL via INTRAVENOUS

## 2020-09-12 MED ORDER — HYDROCODONE-ACETAMINOPHEN 5-325 MG PO TABS
1.0000 | ORAL_TABLET | ORAL | Status: DC | PRN
  Administered 2020-09-12 – 2020-09-14 (×5): 1 via ORAL
  Filled 2020-09-12 (×5): qty 1

## 2020-09-12 MED ORDER — SODIUM CHLORIDE 0.9 % IV BOLUS (SEPSIS)
1000.0000 mL | Freq: Once | INTRAVENOUS | Status: AC
  Administered 2020-09-12: 1000 mL via INTRAVENOUS

## 2020-09-12 MED ORDER — SODIUM CHLORIDE 0.9 % IV SOLN
2.0000 g | INTRAVENOUS | Status: DC
  Administered 2020-09-12: 2 g via INTRAVENOUS
  Filled 2020-09-12: qty 20

## 2020-09-12 MED ORDER — GUAIFENESIN ER 600 MG PO TB12
600.0000 mg | ORAL_TABLET | Freq: Two times a day (BID) | ORAL | Status: DC
  Administered 2020-09-12 – 2020-09-14 (×5): 600 mg via ORAL
  Filled 2020-09-12 (×5): qty 1

## 2020-09-12 MED ORDER — METHYLPREDNISOLONE SODIUM SUCC 125 MG IJ SOLR
60.0000 mg | INTRAMUSCULAR | Status: DC
  Administered 2020-09-13 – 2020-09-14 (×2): 60 mg via INTRAVENOUS
  Filled 2020-09-12 (×2): qty 2

## 2020-09-12 MED ORDER — IPRATROPIUM-ALBUTEROL 0.5-2.5 (3) MG/3ML IN SOLN
3.0000 mL | RESPIRATORY_TRACT | Status: DC
  Administered 2020-09-12: 3 mL via RESPIRATORY_TRACT
  Filled 2020-09-12 (×2): qty 3

## 2020-09-12 MED ORDER — IPRATROPIUM-ALBUTEROL 0.5-2.5 (3) MG/3ML IN SOLN
3.0000 mL | Freq: Four times a day (QID) | RESPIRATORY_TRACT | Status: DC
  Administered 2020-09-13 – 2020-09-14 (×5): 3 mL via RESPIRATORY_TRACT
  Filled 2020-09-12 (×6): qty 3

## 2020-09-12 MED ORDER — SODIUM CHLORIDE 0.9 % IV SOLN
500.0000 mg | INTRAVENOUS | Status: DC
  Administered 2020-09-12: 500 mg via INTRAVENOUS
  Filled 2020-09-12 (×3): qty 500

## 2020-09-12 MED ORDER — ENOXAPARIN SODIUM 40 MG/0.4ML IJ SOSY
40.0000 mg | PREFILLED_SYRINGE | INTRAMUSCULAR | Status: DC
  Administered 2020-09-12: 40 mg via SUBCUTANEOUS
  Filled 2020-09-12 (×2): qty 0.4

## 2020-09-12 MED ORDER — SODIUM CHLORIDE 0.9% FLUSH
3.0000 mL | Freq: Two times a day (BID) | INTRAVENOUS | Status: DC
  Administered 2020-09-12 – 2020-09-14 (×5): 3 mL via INTRAVENOUS

## 2020-09-12 MED ORDER — ALBUTEROL SULFATE (2.5 MG/3ML) 0.083% IN NEBU
2.5000 mg | INHALATION_SOLUTION | Freq: Once | RESPIRATORY_TRACT | Status: AC
  Administered 2020-09-12: 2.5 mg via RESPIRATORY_TRACT
  Filled 2020-09-12: qty 3
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
## 2020-09-12 NOTE — ED Notes (Signed)
Attempted report 

## 2020-09-12 NOTE — ED Triage Notes (Addendum)
[place]  Seen at another ED.  Since yesterday he started having increased SOB.  Reports pain to abd and back only with coughing.  Using inhalers.  88% on room air.  Pt placed on 2 liters [place] at triage.

## 2020-09-12 NOTE — Plan of Care (Signed)

## 2020-09-12 NOTE — Progress Notes (Signed)
Patient arrived on the unit from the ED. Oriented to the room. Denied any distress or pain. Call bell placed within arm reach. Will continue to monitor for patient needs.

## 2020-09-12 NOTE — ED Provider Notes (Addendum)
[facility] EMERGENCY DEPARTMENT Provider Note   CSN: [id number] Arrival date & time: 09/12/20  4431     History Chief Complaint  Patient presents with   Shortness of Breath    Donald Evans is a 50 y.o. male.  The history is provided by the patient.  Shortness of Breath Severity:  Severe Onset quality:  Gradual Duration:  1 week Progression:  Worsening Chronicity:  New Context: URI (Cough with sputum production, on steroids.  Got sick while on vacation last week.)   Relieved by:  Inhaler Worsened by:  Exertion Associated symptoms: cough, sputum production and wheezing   Associated symptoms: no abdominal pain, no chest pain, no claudication, no ear pain, no fever, no rash, no sore throat and no vomiting       Past Medical History:  Diagnosis Date   Allergy    GERD (gastroesophageal reflux disease)    Gout    on HCTZ   Hyperlipidemia    Hypertension    Sarcoidosis     Stage 1 sarcoidosis - at age 43.  Spontaneous remission    Patient Active Problem List   Diagnosis Date Noted   Actinic keratoses 07/20/2020   Hyperlipidemia    Hypertension    Routine general medical examination at a health care facility 01/30/2011   Mild intermittent asthma 11/06/2010   ALLERGIC RHINITIS, CHRONIC 09/01/2009   GERD 08/31/2006    Past Surgical History:  Procedure Laterality Date   TOTAL HIP ARTHROPLASTY Left 2021   Alusio       No family history on file.  Social History   Tobacco Use   Smoking status: Former    Packs/day: 1.00    Years: 10.00    Pack years: 10.00    Types: Cigarettes    Quit date: 11/03/2004    Years since quitting: 15.8   Smokeless tobacco: Never  Substance Use Topics   Alcohol use: Yes    Comment: Occasional to regular   Drug use: No    Home Medications Prior to Admission medications   Medication Sig Start Date End Date Taking? Authorizing Provider  albuterol (PROAIR HFA) 108 (90 Base) MCG/ACT inhaler Inhale 2  puffs into the lungs every 4 (four) hours as needed. 07/20/20   Karie Schwalbe, MD  amLODipine (NORVASC) 5 MG tablet Take 1 tablet (5 mg total) by mouth daily. 07/20/20   Karie Schwalbe, MD  cetirizine (ZYRTEC) 10 MG tablet Take 10 mg by mouth daily.    [provider]  losartan (COZAAR) 50 MG tablet Take 1 tablet (50 mg total) by mouth daily. 06/15/20   Karie Schwalbe, MD    Allergies    Hctz [hydrochlorothiazide]  Review of Systems   Review of Systems  Constitutional:  Negative for chills and fever.  HENT:  Negative for ear pain and sore throat.   Eyes:  Negative for pain and visual disturbance.  Respiratory:  Positive for cough, sputum production, shortness of breath and wheezing.   Cardiovascular:  Negative for chest pain, palpitations and claudication.  Gastrointestinal:  Negative for abdominal pain and vomiting.  Genitourinary:  Negative for dysuria and hematuria.  Musculoskeletal:  Negative for arthralgias and back pain.  Skin:  Negative for color change and rash.  Neurological:  Negative for seizures and syncope.  All other systems reviewed and are negative.  Physical Exam Updated Vital Signs BP 139/81   Pulse (!) 101   Temp 99.4 F (37.4 C) (Tympanic)  Resp 20   SpO2 96%   Physical Exam Vitals and nursing note reviewed.  Constitutional:      General: He is not in acute distress.    Appearance: He is well-developed.  HENT:     Head: Normocephalic and atraumatic.  Eyes:     Conjunctiva/sclera: Conjunctivae normal.     Pupils: Pupils are equal, round, and reactive to light.  Cardiovascular:     Rate and Rhythm: Normal rate and regular rhythm.     Heart sounds: No murmur heard. Pulmonary:     Effort: Tachypnea present. No respiratory distress.     Breath sounds: Decreased breath sounds and wheezing present.  Abdominal:     Palpations: Abdomen is soft.     Tenderness: There is no abdominal tenderness.  Musculoskeletal:     Cervical back: Normal  range of motion and neck supple.  Skin:    General: Skin is warm and dry.     Findings: No rash.  Neurological:     General: No focal deficit present.     Mental Status: He is alert.  Psychiatric:        Mood and Affect: Mood normal.    ED Results / Procedures / Treatments   Labs (all labs ordered are listed, but only abnormal results are displayed) Labs Reviewed  COMPREHENSIVE METABOLIC PANEL - Abnormal; Notable for the following components:      Result Value   Sodium 134 (*)    Glucose, Bld 115 (*)    Calcium 8.6 (*)    Total Protein 6.4 (*)    Albumin 3.3 (*)    All other components within normal limits  CBC WITH DIFFERENTIAL/PLATELET - Abnormal; Notable for the following components:   Monocytes Absolute 1.8 (*)    All other components within normal limits  APTT - Abnormal; Notable for the following components:   aPTT 22 (*)    All other components within normal limits  RESP PANEL BY RT-PCR (FLU A&B, COVID) ARPGX2  CULTURE, BLOOD (ROUTINE X 2)  CULTURE, BLOOD (ROUTINE X 2)  URINE CULTURE  LACTIC ACID, PLASMA  PROTIME-INR  LACTIC ACID, PLASMA  URINALYSIS, ROUTINE W REFLEX MICROSCOPIC    EKG EKG Interpretation  Date/Time:  Sunday September 12 2020 09:52:02 EDT Ventricular Rate:  114 PR Interval:  128 QRS Duration: 86 QT Interval:  322 QTC Calculation: 443 R Axis:   71 Text Interpretation: Sinus tachycardia Otherwise normal ECG Confirmed by Virgina Norfolk ([id number]) on 09/12/2020 10:08:30 AM  Radiology DG Chest Port 1 View  Result Date: 09/12/2020 CLINICAL DATA:  Questionable sepsis.  Dyspnea. EXAM: PORTABLE CHEST 1 VIEW COMPARISON:  06/06/2006 chest radiograph. FINDINGS: Stable cardiomediastinal silhouette with normal heart size. No pneumothorax. No pleural effusion. Lungs appear clear, with no acute consolidative airspace disease and no pulmonary edema. IMPRESSION: No active disease. Electronically Signed   By: Delbert Phenix M.D.   On: 09/12/2020 10:24     Procedures .Critical Care  Date/Time: 09/12/2020 12:04 PM Performed by: Virgina Norfolk, DO Authorized by: Virgina Norfolk, DO   Critical care provider statement:    Critical care time (minutes):  35   Critical care was necessary to treat or prevent imminent or life-threatening deterioration of the following conditions:  Respiratory failure   Critical care was time spent personally by me on the following activities:  Blood draw for specimens, development of treatment plan with patient or surrogate, discussions with primary provider, evaluation of patient's response to treatment, obtaining history from patient or  surrogate, examination of patient, ordering and performing treatments and interventions, ordering and review of laboratory studies, ordering and review of radiographic studies, pulse oximetry, re-evaluation of patient's condition and review of old charts   I assumed direction of critical care for this patient from another provider in my specialty: no     Medications Ordered in ED Medications  cefTRIAXone (ROCEPHIN) 2 g in sodium chloride 0.9 % 100 mL IVPB (0 g Intravenous Stopped 09/12/20 1059)  azithromycin (ZITHROMAX) 500 mg in sodium chloride 0.9 % 250 mL IVPB (500 mg Intravenous New Bag/Given 09/12/20 1104)  sodium chloride 0.9 % bolus 1,000 mL (1,000 mLs Intravenous New Bag/Given 09/12/20 1023)  albuterol (VENTOLIN HFA) 108 (90 Base) MCG/ACT inhaler 6 puff (6 puffs Inhalation Given 09/12/20 1059)  methylPREDNISolone sodium succinate (SOLU-MEDROL) 125 mg/2 mL injection 125 mg (125 mg Intravenous Given 09/12/20 1102)    ED Course  I have reviewed the triage vital signs and the nursing notes.  Pertinent labs & imaging results that were available during my care of the patient were reviewed by me and considered in my medical decision making (see chart for details).    MDM Rules/Calculators/A&P                           Royetta Car is here with shortness of breath, cough,  sputum production.  Low-grade fever, tachycardia upon arrival.  Diminished breath sounds, increased work of breathing upon arrival.  EKG shows sinus rhythm.  No ischemic changes.  States that he is not been feeling well for a week.  Has been on steroids with not much help.  Has had bad cough with sputum production.  Got sick after going away on vacation where he thinks that may be there was some type of environmental allergy.  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  He states that he is been pretty good and not had any kind of respiratory flares in a while until this past week.  Given low-grade fever and tachycardia sepsis work-up was initiated but ultimately chest x-ray showed no evidence of infection.  He did receive broad-spectrum IV antibiotics but overall does not meet sepsis criteria.  Overall seems that he has respiratory failure and he does have some hypoxia as his oxygen was in the 80s upon arrival.  Suspect that this is from a reactive airway disease process.  He was given albuterol and Solu-Medrol.  COVID and influenza testing negative.  Will admit for further care.  This chart was dictated using voice recognition software.  Despite best efforts to proofread,  errors can occur which can change the documentation meaning.   Final Clinical Impression(s) / ED Diagnoses Final diagnoses:  Acute respiratory failure with hypoxia (HCC)  SOB (shortness of breath)    Rx / DC Orders ED Discharge Orders     None        Virgina Norfolk, DO 09/12/20 1203    Virgina Norfolk, DO 09/12/20 1204

## 2020-09-12 NOTE — H&P (Signed)
History and Physical    Donald Evans RXV:[id number] DOB: [birth year] DOA: 09/12/2020  PCP: Karie Schwalbe, MD Consultants:  none Patient coming from:  Home - lives with wife beth and their son  Chief Complaint: worsening shortness of breath and cough  HPI: Donald Evans is a 50 y.o. male with medical history significant of moderate persistent asthma, HTN, HLD, hx of pneumonitis who presented with 1 week history of cough, wheezing and shortness of breath. Symptoms started while they were on vacation at [facility]. His wife had symptoms of coughing and congestion first then her husband started with cough, congestion and shortness of breath. Cough was productive. They thought it was an allergen in the air. He was using his inhaler every 15-20 minutes.  Went to ER on Monday night while on vacation. He got a breathing treatment, IV steroids, IV magnesium and sent home on steroids. She states his oxygen was never over 90% at ER.  He felt better for a few days then he started to cough up everything and have continuing shortness of breath. They drove home yesterday and he was breathing better, but overnight he got worse and was extremely short of breath so came to ER. No known fevers or chills at home. No sick contacts that they are aware of. His cough is productive with thick sputum.  He has been taking dayquil, nyquil and zyrtec. Denies any headache, chest pain, palpitations, stomach pain, N/V/D. Good PO intake. Denies any swelling in his legs. Does state his lower back hurts from coughing so much.   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 Last saw pulmonology in 2015. This is the first exacerbation he has had in 8 years. Was not hospitalized for his last one.  He has remote hx of smoking, stopped smoking in 2007. Does use smokeless tobacco. No other industrial exposure. Does not tolerate singulair  or claritin.    ED Course: vitals: temp: 100.2, bp: 159/102, HR: 117, RR: 24, oxygen: 88% on room air. Pertinent labs: no white count, normal lactic acid, CXR with no acute process. Covid/flu negative. Sepsis w/u initiated but doesn't meet criteria. Asked to admit for respiratory failure secondary to asthma/COPD exacerbation.   Review of Systems: As per HPI; otherwise review of systems reviewed and negative.   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    Past Surgical History:  Procedure Laterality Date   TOTAL HIP ARTHROPLASTY Left 2021   Alusio    Social History   Socioeconomic History   Marital status: Married    Spouse name: Not on file   Number of children: 1   Years of education: Not on file   Highest education level: Not on file  Occupational History   Occupation: Building maintenance    Employer: [employer]  Tobacco Use   Smoking status: Former    Packs/day: 1.00    Years: 10.00    Pack years: 10.00    Types: Cigarettes    Quit date: 11/03/2004    Years since quitting: 15.8   Smokeless tobacco: Never  Substance and Sexual Activity   Alcohol  use: Yes    Comment: Occasional to regular   Drug use: No   Sexual activity: Not on file  Other Topics Concern   Not on file  Social History Narrative   Not on file   Social Determinants of Health   Financial Resource Strain: Not on file  Food Insecurity: Not on file  Transportation Needs: Not on file  Physical Activity: Not on file  Stress: Not on file  Social Connections: Not on file  Intimate Partner Violence: Not on file    Allergies  Allergen Reactions   Hctz [Hydrochlorothiazide]     Gout attack    No family history on file.  Prior to Admission medications    Medication Sig Start Date End Date Taking? Authorizing Provider  albuterol (PROAIR HFA) 108 (90 Base) MCG/ACT inhaler Inhale 2 puffs into the lungs every 4 (four) hours as needed. 07/20/20   Karie Schwalbe, MD  amLODipine (NORVASC) 5 MG tablet Take 1 tablet (5 mg total) by mouth daily. 07/20/20   Karie Schwalbe, MD  cetirizine (ZYRTEC) 10 MG tablet Take 10 mg by mouth daily.    [provider]  losartan (COZAAR) 50 MG tablet Take 1 tablet (50 mg total) by mouth daily. 06/15/20   Karie Schwalbe, MD    Physical Exam: Vitals:   09/12/20 1045 09/12/20 1216 09/12/20 1300 09/12/20 1325  BP: 139/81 123/80 139/88 (!) 144/105  Pulse: (!) 101 (!) 107 99 94  Resp: 20 18 (!) 25 (!) 22  Temp:      TempSrc:      SpO2: 96% 95% 93% 95%     General:  Appears calm and comfortable and is in NAD Eyes:  PERRL, EOMI, normal lids, iris ENT:  grossly normal hearing, lips & tongue, mmm; appropriate dentition Neck:  no LAD, masses or thyromegaly; no carotid bruits Cardiovascular:  RRR, no m/r/g. No LE edema.  Respiratory:   expiratory wheezing bilateral anterior apices and bilateral upper lobes. Normal base lung sounds. No crackles/rales.  Normal respiratory effort. Abdomen:  soft, NT, ND, NABS Back:   normal alignment, no CVAT Skin:  no rash or induration seen on limited exam Musculoskeletal:  grossly normal tone BUE/BLE, good ROM, no bony abnormality Lower extremity:  No LE edema.  Limited foot exam with no ulcerations.  2+ distal pulses. Psychiatric:  grossly normal mood and affect, speech fluent and appropriate, AOx3 Neurologic:  CN 2-12 grossly intact, moves all extremities in coordinated fashion, sensation intact    Radiological Exams on Admission: Independently reviewed - see discussion in A/P where applicable  DG Chest Port 1 View  Result Date: 09/12/2020 CLINICAL DATA:  Questionable sepsis.  Dyspnea. EXAM: PORTABLE CHEST 1 VIEW COMPARISON:  06/06/2006 chest radiograph.  FINDINGS: Stable cardiomediastinal silhouette with normal heart size. No pneumothorax. No pleural effusion. Lungs appear clear, with no acute consolidative airspace disease and no pulmonary edema. IMPRESSION: No active disease. Electronically Signed   By: Delbert Phenix M.D.   On: 09/12/2020 10:24    EKG: Independently reviewed. Sinus tachycardia with rate 107; nonspecific ST changes with no evidence of acute ischemia   Labs on Admission: I have personally reviewed the available labs and imaging studies at the time of the admission.  Pertinent labs:  CXR: no acute process Lactic acid: wnl No white count.  Does not meet sepsis criteria.   Assessment/Plan Principal Problem:   Moderate asthma with acute exacerbation -vs. COPD exac/past remote hx of pneumonitis -no exacerbations in 8+  years -scheduled duonebs x 24 hours with prn SABA -continue IV steroids q 24 hours -with hx of possible COPD will continue azithromycin as he has 2/3 cardinal signs and have sputum cx pending.  -mucinex BID and robitussin prn  -smoking cessation for smokeless tobacco.  -wean oxygen as tolerated   Active Problems:   Acute respiratory failure with hypoxia (HCC) -secondary to #1.  -maintaining sats on 2.5L of oxygen -wean as tolerated -well criteria -2 and DVT unlikely.   Back pain -secondary to coughing -prn tylenol and norco    Hypertension -well controlled. Continue home medication with norvasc and losartan.     Hyperlipidemia -follow outpatient.    There is no height or weight on file to calculate BMI.    Level of care: Med-Surg DVT prophylaxis:  Lovenox or  Code Status:  Full - confirmed with patient Family Communication: wife present: beth Legan  Disposition Plan:  The patient is from: home  Anticipated d/c is to: home  Anticipated d/c date will depend on clinical response to treatment  Patient is currently: acutely ill Consults called: none  Admission status:  observation     Orland Mustard MD [facility]   How to contact the [facility] Attending or Consulting provider 7A - 7P or covering provider during after hours 7P -7A, for this patient?  Check the care team in [facility] and look for a) attending/consulting TR[facility] provider listed and b) the [facility] team listed Log into [email or web address] and use 's universal password to access. If you do not have the password, please contact the hospital operator. Locate the [facility] provider you are looking for under [facility] and page to a number that you can be directly reached. If you still have difficulty reaching the provider, please page the [facility] (Director on Call) for the Hospitalists listed on amion for assistance.   09/12/2020, 1:30 PM

## 2020-09-12 NOTE — Progress Notes (Signed)
ELINK monitoring for sepsis protocol. 

## 2020-09-13 DIAGNOSIS — Z888 Allergy status to other drugs, medicaments and biological substances status: Secondary | ICD-10-CM | POA: Diagnosis not present

## 2020-09-13 DIAGNOSIS — Z720 Tobacco use: Secondary | ICD-10-CM | POA: Diagnosis not present

## 2020-09-13 DIAGNOSIS — D869 Sarcoidosis, unspecified: Secondary | ICD-10-CM | POA: Diagnosis present

## 2020-09-13 DIAGNOSIS — Z20822 Contact with and (suspected) exposure to covid-19: Secondary | ICD-10-CM | POA: Diagnosis present

## 2020-09-13 DIAGNOSIS — K219 Gastro-esophageal reflux disease without esophagitis: Secondary | ICD-10-CM | POA: Diagnosis present

## 2020-09-13 DIAGNOSIS — J4541 Moderate persistent asthma with (acute) exacerbation: Secondary | ICD-10-CM | POA: Diagnosis present

## 2020-09-13 DIAGNOSIS — Z716 Tobacco abuse counseling: Secondary | ICD-10-CM | POA: Diagnosis not present

## 2020-09-13 DIAGNOSIS — J9601 Acute respiratory failure with hypoxia: Secondary | ICD-10-CM | POA: Diagnosis present

## 2020-09-13 DIAGNOSIS — E785 Hyperlipidemia, unspecified: Secondary | ICD-10-CM | POA: Diagnosis present

## 2020-09-13 DIAGNOSIS — J45901 Unspecified asthma with (acute) exacerbation: Secondary | ICD-10-CM | POA: Diagnosis not present

## 2020-09-13 DIAGNOSIS — Z79899 Other long term (current) drug therapy: Secondary | ICD-10-CM | POA: Diagnosis not present

## 2020-09-13 DIAGNOSIS — Z96642 Presence of left artificial hip joint: Secondary | ICD-10-CM | POA: Diagnosis present

## 2020-09-13 DIAGNOSIS — R0602 Shortness of breath: Secondary | ICD-10-CM | POA: Diagnosis present

## 2020-09-13 DIAGNOSIS — I10 Essential (primary) hypertension: Secondary | ICD-10-CM | POA: Diagnosis present

## 2020-09-13 DIAGNOSIS — M1A9XX0 Chronic gout, unspecified, without tophus (tophi): Secondary | ICD-10-CM | POA: Diagnosis present

## 2020-09-13 DIAGNOSIS — M549 Dorsalgia, unspecified: Secondary | ICD-10-CM | POA: Diagnosis present

## 2020-09-13 MED ORDER — AMLODIPINE BESYLATE 5 MG PO TABS
5.0000 mg | ORAL_TABLET | Freq: Every day | ORAL | Status: DC
  Administered 2020-09-13 – 2020-09-14 (×2): 5 mg via ORAL
  Filled 2020-09-13 (×2): qty 1

## 2020-09-13 MED ORDER — LOSARTAN POTASSIUM 50 MG PO TABS
50.0000 mg | ORAL_TABLET | Freq: Every day | ORAL | Status: DC
  Administered 2020-09-13: 50 mg via ORAL
  Filled 2020-09-13: qty 1

## 2020-09-13 NOTE — Plan of Care (Signed)

## 2020-09-13 NOTE — Progress Notes (Signed)
Oxygen trail  Started at 2L [place] 93% RA 90% Standing up and walking to door 88% Walking around 90 % and up to 91% Walking and coughing dropped down to 89% then 88% and lowest was 87% on RA. Back in the room ended with 89% and placed back on 2L [place] 92%

## 2020-09-13 NOTE — Progress Notes (Signed)
[facility] Progress Note  Patient: Donald Evans    XBM:[id number]  DOA: 09/12/2020     Date of Service: the patient was seen and examined on 09/13/2020  Brief hospital course: Past medical history of asthma, HTN, HLD.  Presents with complaints of cough shortness of breath.  Found to have asthma exacerbation. Currently plan is continue IV steroids and nebulizer therapy.  Subjective: Denies any fevers denies any recent chemical exposure.  No nausea no vomiting.  Went camping but did not have any exposure to smoke/campfire.  No chest pain no abdominal pain.  No nausea no vomiting.  Does not use oxygen at baseline.  Assessment and Plan: 1.  Acute hypoxic respiratory failure Asthma exacerbation 88% on room air. Currently on 4 LPM. Chest x-ray negative for any acute pneumonia. Exam reveals bilateral expiratory wheezing mild. Was started on antibiotics.  Will DC. Continue DuoNebs. Continue Solu-Medrol. Continue cough medication. Monitor.  Anticipating discharge on room air.  2.  HTN Blood pressure stable. On Norvasc. Continue.  Holding losartan due to concern with bradykinin pathway.  Scheduled Meds:  amLODipine  5 mg Oral Daily   enoxaparin (LOVENOX) injection  40 mg Subcutaneous Q24H   guaiFENesin  600 mg Oral BID   ipratropium-albuterol  3 mL Nebulization Q6H   methylPREDNISolone (SOLU-MEDROL) injection  60 mg Intravenous Q24H   sodium chloride flush  3 mL Intravenous Q12H   Continuous Infusions:  sodium chloride     PRN Meds: sodium chloride, acetaminophen **OR** acetaminophen, albuterol, guaiFENesin-dextromethorphan, HYDROcodone-acetaminophen, sodium chloride flush  There is no height or weight on file to calculate BMI.        DVT Prophylaxis:   enoxaparin (LOVENOX) injection 40 mg Start: 09/12/20 1400    Advance goals of care discussion: Pt is Full code.  Family Communication: family was present at bedside, at the time of interview.  The pt provided  permission to discuss medical plan with the family. Opportunity was given to ask question and all questions were answered satisfactorily.   Data Reviewed: I have personally reviewed and interpreted daily labs, tele strips, imaging. HIV COVID-19 negative.  Influenza negative.  Physical Exam:  General: Appear in mild distress, no Rash; Oral Mucosa Clear, moist. no Abnormal Neck Mass Or lumps, Conjunctiva normal  Cardiovascular: S1 and S2 Present, no Murmur, Respiratory: good respiratory effort, Bilateral Air entry present and no Crackles, Occasional  wheezes Abdomen: Bowel Sound present, Soft and no tenderness Extremities: trace Pedal edema Neurology: alert and oriented to time, place, and person affect appropriate. no new focal deficit Gait not checked due to patient safety concerns  Vitals:   09/12/20 2041 09/13/20 0044 09/13/20 0817 09/13/20 0900  BP: 118/66 134/75 (!) 154/83   Pulse: 88 79 89   Resp: 17 18 19    Temp: 98.3 F (36.8 C) (!) 97.4 F (36.3 C) 98.3 F (36.8 C)   TempSrc: Oral Oral Oral   SpO2: 92% 98% 92% 92%    Disposition:  Status is: Inpatient  Remains inpatient appropriate because:Inpatient level of care appropriate due to severity of illness  Dispo: The patient is from: Home              Anticipated d/c is to: Home              Patient currently is not medically stable to d/c.   Difficult to place patient No  Time spent: 35 minutes. I reviewed all nursing notes, pharmacy notes, vitals, pertinent old records. I have discussed plan  of care as described above with RN.  Author: Lynden Oxford, MD [facility] 09/13/2020 4:34 PM  To reach On-call, see care teams to locate the attending and reach out via [email or web address]. Between 7PM-7AM, please contact night-coverage If you still have difficulty reaching the attending provider, please page the [facility] (Director on Call) for [facility] on amion for assistance.

## 2020-09-14 MED ORDER — AMLODIPINE BESYLATE 5 MG PO TABS: 10.0000 mg | ORAL_TABLET | Freq: Every day | ORAL | 0 refills | Status: DC

## 2020-09-14 MED ORDER — PULSE OXIMETER MISC: 1.0000 | 0 refills | Status: DC | PRN

## 2020-09-14 MED ORDER — ALBUTEROL SULFATE (2.5 MG/3ML) 0.083% IN NEBU: 2.5000 mg | INHALATION_SOLUTION | RESPIRATORY_TRACT | 0 refills | Status: DC | PRN

## 2020-09-14 MED ORDER — GUAIFENESIN ER 600 MG PO TB12: 600.0000 mg | ORAL_TABLET | Freq: Two times a day (BID) | ORAL | 0 refills | Status: DC

## 2020-09-14 MED ORDER — PREDNISONE 10 MG PO TABS: ORAL_TABLET | ORAL | 0 refills | Status: DC

## 2020-09-14 MED ORDER — IPRATROPIUM-ALBUTEROL 0.5-2.5 (3) MG/3ML IN SOLN: 3.0000 mL | Freq: Four times a day (QID) | RESPIRATORY_TRACT | Status: DC | PRN

## 2020-09-14 MED ORDER — HYDROCOD POLST-CPM POLST ER 10-8 MG/5ML PO SUER: 5.0000 mL | Freq: Two times a day (BID) | ORAL | Status: DC | PRN

## 2020-09-14 MED ORDER — HYDROCOD POLST-CPM POLST ER 10-8 MG/5ML PO SUER: 5.0000 mL | Freq: Two times a day (BID) | ORAL | 0 refills | Status: DC | PRN

## 2020-09-14 NOTE — Plan of Care (Signed)

## 2020-09-14 NOTE — Progress Notes (Signed)
Donald Evans to be D/C'd  per MD order.  Discussed with the patient and all questions fully answered.  VSS, Skin clean, dry and intact without evidence of skin break down, no evidence of skin tears noted.  IV catheter discontinued intact. Site without signs and symptoms of complications. Dressing and pressure applied.  An After Visit Summary was printed and given to the patient. Patient received prescription.  D/c education completed with patient/family including follow up instructions, medication list, d/c activities limitations if indicated, with other d/c instructions as indicated by MD - patient able to verbalize understanding, all questions fully answered.   Patient instructed to return to ED, call 911, or call MD for any changes in condition.   Patient to be escorted via WC, and D/C home via private auto.

## 2020-09-14 NOTE — TOC Transition Note (Signed)
Transition of Care [facility]) - CM/SW Discharge Note   Patient Details  Name: ALBARAA SWINGLE MRN: [id number] Date of Birth: 1970/07/29  Transition of Care [facility] CM/SW Contact:  Epifanio Lesches, RN Phone Number: 09/14/2020, 12:11 PM   Clinical Narrative:    Patient will DC to: home  Anticipated DC date: 09/14/2020 Family notified: yes, wife Transport by: car  Per MD patient ready for DC today. RN, patient, and patient's wife notified of DC.  Pt with DME: nebulizer need. Order noted. Referral made [facility] . Nebulizer will be delivered to bedside prior to discharge.   Pt without Rx med concerns.  Post hospital f/u noted on AVS.  RNCM will sign off for now as intervention is no longer needed.      Final next level of care: Home/Self Care Barriers to Discharge: No Barriers Identified   Patient Goals and CMS Choice     Choice offered to / list presented to : Patient  Discharge Placement                       Discharge Plan and Services                DME Arranged: Nebulizer machine DME Agency: [facility] Date DME Agency Contacted: 09/14/20 Time DME Agency Contacted: [id number] Representative spoke with at DME Agency: Velna Hatchet            Social Determinants of Health (SDOH) Interventions     Readmission Risk Interventions No flowsheet data found.

## 2020-09-14 NOTE — Progress Notes (Addendum)
Walk test. Pt sat on RA at rest 92%, with activity 91%. Pt desat while coughing to 89%

## 2020-09-14 NOTE — Plan of Care (Signed)
Problem: Education: Goal: Knowledge of General Education information will improve Description: Including pain rating scale, medication(s)/side effects and non-pharmacologic comfort measures 09/14/2020 1039 by Quintella Baton, RN Outcome: Adequate for Discharge 09/14/2020 1038 by Quintella Baton, RN Outcome: Progressing 09/14/2020 0951 by Quintella Baton, RN Outcome: Progressing   Problem: Health Behavior/Discharge Planning: Goal: Ability to manage health-related needs will improve 09/14/2020 1039 by Quintella Baton, RN Outcome: Adequate for Discharge 09/14/2020 1038 by Quintella Baton, RN Outcome: Progressing 09/14/2020 0951 by Quintella Baton, RN Outcome: Progressing   Problem: Clinical Measurements: Goal: Ability to maintain clinical measurements within normal limits will improve 09/14/2020 1039 by Quintella Baton, RN Outcome: Adequate for Discharge 09/14/2020 1038 by Quintella Baton, RN Outcome: Progressing 09/14/2020 0951 by Quintella Baton, RN Outcome: Progressing Goal: Will remain free from infection 09/14/2020 1039 by Quintella Baton, RN Outcome: Adequate for Discharge 09/14/2020 1038 by Quintella Baton, RN Outcome: Progressing 09/14/2020 0951 by Quintella Baton, RN Outcome: Progressing Goal: Diagnostic test results will improve 09/14/2020 1039 by Quintella Baton, RN Outcome: Adequate for Discharge 09/14/2020 1038 by Quintella Baton, RN Outcome: Progressing 09/14/2020 0951 by Quintella Baton, RN Outcome: Progressing Goal: Respiratory complications will improve 09/14/2020 1039 by Quintella Baton, RN Outcome: Adequate for Discharge 09/14/2020 1038 by Quintella Baton, RN Outcome: Progressing 09/14/2020 0951 by Quintella Baton, RN Outcome: Progressing Goal: Cardiovascular complication will be avoided 09/14/2020 1039 by Quintella Baton, RN Outcome: Adequate for Discharge 09/14/2020 1038 by Quintella Baton, RN Outcome: Progressing 09/14/2020 0951 by Quintella Baton, RN Outcome: Progressing   Problem: Activity: Goal: Risk for activity intolerance will decrease 09/14/2020 1039 by Quintella Baton, RN Outcome: Adequate for Discharge 09/14/2020 1038 by Quintella Baton, RN Outcome: Progressing 09/14/2020 0951 by Quintella Baton, RN Outcome: Progressing   Problem: Nutrition: Goal: Adequate nutrition will be maintained 09/14/2020 1039 by Quintella Baton, RN Outcome: Adequate for Discharge 09/14/2020 1038 by Quintella Baton, RN Outcome: Progressing 09/14/2020 0951 by Quintella Baton, RN Outcome: Progressing   Problem: Coping: Goal: Level of anxiety will decrease 09/14/2020 1039 by Quintella Baton, RN Outcome: Adequate for Discharge 09/14/2020 1038 by Quintella Baton, RN Outcome: Progressing 09/14/2020 0951 by Quintella Baton, RN Outcome: Progressing   Problem: Elimination: Goal: Will not experience complications related to bowel motility 09/14/2020 1039 by Quintella Baton, RN Outcome: Adequate for Discharge 09/14/2020 1038 by Quintella Baton, RN Outcome: Progressing 09/14/2020 0951 by Quintella Baton, RN Outcome: Progressing Goal: Will not experience complications related to urinary retention 09/14/2020 1039 by Quintella Baton, RN Outcome: Adequate for Discharge 09/14/2020 1038 by Quintella Baton, RN Outcome: Progressing 09/14/2020 0951 by Quintella Baton, RN Outcome: Progressing   Problem: Pain Managment: Goal: General experience of comfort will improve 09/14/2020 1039 by Quintella Baton, RN Outcome: Adequate for Discharge 09/14/2020 1038 by Quintella Baton, RN Outcome: Progressing 09/14/2020 0951 by Quintella Baton, RN Outcome: Progressing   Problem: Safety: Goal: Ability to remain free from injury will improve 09/14/2020 1039 by Quintella Baton, RN Outcome: Adequate for Discharge 09/14/2020 1038 by Quintella Baton, RN Outcome: Progressing 09/14/2020 0951 by Quintella Baton, RN Outcome: Progressing   Problem:  Skin Integrity: Goal: Risk for impaired skin integrity will decrease 09/14/2020 1039 by Quintella Baton, RN Outcome: Adequate for Discharge 09/14/2020 1038 by Quintella Baton, RN Outcome: Progressing 09/14/2020 0951 by Quintella Baton, RN  Outcome: Progressing   Problem: Health Behavior/Discharge Planning: Goal: Ability to manage health-related needs will improve 09/14/2020 1039 by Quintella Baton, RN Outcome: Adequate for Discharge 09/14/2020 1038 by Quintella Baton, RN Outcome: Progressing 09/14/2020 0951 by Quintella Baton, RN Outcome: Progressing   Problem: Health Behavior/Discharge Planning: Goal: Ability to manage health-related needs will improve 09/14/2020 1039 by Quintella Baton, RN Outcome: Adequate for Discharge 09/14/2020 1038 by Quintella Baton, RN Outcome: Progressing 09/14/2020 0951 by Quintella Baton, RN Outcome: Progressing

## 2020-09-14 NOTE — Plan of Care (Signed)
  Problem: Education: Goal: Knowledge of General Education information will improve Description: Including pain rating scale, medication(s)/side effects and non-pharmacologic comfort measures 09/14/2020 1038 by Quintella Baton, RN Outcome: Progressing 09/14/2020 0951 by Quintella Baton, RN Outcome: Progressing   Problem: Health Behavior/Discharge Planning: Goal: Ability to manage health-related needs will improve 09/14/2020 1038 by Quintella Baton, RN Outcome: Progressing 09/14/2020 0951 by Quintella Baton, RN Outcome: Progressing   Problem: Clinical Measurements: Goal: Ability to maintain clinical measurements within normal limits will improve 09/14/2020 1038 by Quintella Baton, RN Outcome: Progressing 09/14/2020 0951 by Quintella Baton, RN Outcome: Progressing Goal: Will remain free from infection 09/14/2020 1038 by Quintella Baton, RN Outcome: Progressing 09/14/2020 0951 by Quintella Baton, RN Outcome: Progressing Goal: Diagnostic test results will improve 09/14/2020 1038 by Quintella Baton, RN Outcome: Progressing 09/14/2020 0951 by Quintella Baton, RN Outcome: Progressing Goal: Respiratory complications will improve 09/14/2020 1038 by Quintella Baton, RN Outcome: Progressing 09/14/2020 0951 by Quintella Baton, RN Outcome: Progressing Goal: Cardiovascular complication will be avoided 09/14/2020 1038 by Quintella Baton, RN Outcome: Progressing 09/14/2020 0951 by Quintella Baton, RN Outcome: Progressing   Problem: Activity: Goal: Risk for activity intolerance will decrease 09/14/2020 1038 by Quintella Baton, RN Outcome: Progressing 09/14/2020 0951 by Quintella Baton, RN Outcome: Progressing   Problem: Nutrition: Goal: Adequate nutrition will be maintained 09/14/2020 1038 by Quintella Baton, RN Outcome: Progressing 09/14/2020 0951 by Quintella Baton, RN Outcome: Progressing   Problem: Coping: Goal: Level of anxiety will decrease 09/14/2020 1038 by Quintella Baton, RN Outcome: Progressing 09/14/2020 0951 by Quintella Baton, RN Outcome: Progressing   Problem: Elimination: Goal: Will not experience complications related to bowel motility 09/14/2020 1038 by Quintella Baton, RN Outcome: Progressing 09/14/2020 0951 by Quintella Baton, RN Outcome: Progressing Goal: Will not experience complications related to urinary retention 09/14/2020 1038 by Quintella Baton, RN Outcome: Progressing 09/14/2020 0951 by Quintella Baton, RN Outcome: Progressing   Problem: Pain Managment: Goal: General experience of comfort will improve 09/14/2020 1038 by Quintella Baton, RN Outcome: Progressing 09/14/2020 0951 by Quintella Baton, RN Outcome: Progressing   Problem: Safety: Goal: Ability to remain free from injury will improve 09/14/2020 1038 by Quintella Baton, RN Outcome: Progressing 09/14/2020 0951 by Quintella Baton, RN Outcome: Progressing   Problem: Skin Integrity: Goal: Risk for impaired skin integrity will decrease 09/14/2020 1038 by Quintella Baton, RN Outcome: Progressing 09/14/2020 0951 by Quintella Baton, RN Outcome: Progressing

## 2020-09-14 NOTE — Discharge Summary (Signed)
[facility] Discharge Summary   Patient: Donald Evans AOZ:[id number]  PCP: Karie Schwalbe, MD  Date of admission: 09/12/2020   Date of discharge: 09/14/2020      Discharge Diagnoses:  Principal Problem:   Moderate asthma with acute exacerbation Active Problems:   Hypertension   Hyperlipidemia   Acute respiratory failure with hypoxia (HCC)   Asthma attack  Admitted From: Home Disposition:  Home   Recommendations for Outpatient Follow-up:  PCP: Follow-up with PCP in 1 week. Patient will benefit from follow-up with pulmonologist.   Follow-up Information     Karie Schwalbe, MD. Schedule an appointment as soon as possible for a visit in 1 week(s).   Specialties: Internal Medicine, Pediatrics Contact information: [address] [place] [address] [phone]                Discharge Instructions     Diet - low sodium heart healthy   Complete by: As directed    Discharge instructions   Complete by: As directed    It is important that you read the instructions as well as go over your medication list with RN to help you understand your care after this hospitalization.  Please follow-up with PCP in 1-2 weeks.  Please note that we are unable to authorize any refills for discharge medications, once you are discharged. Thus, it is imperative that you return to your primary care physician (or establish a relationship with a primary care physician if you do not have one) for your care needs. So that they can reassess your need for medications and monitor your lab values.  Please request your primary care physician to go over all Hospital Tests and Procedure/Radiological results at the follow up. Please get all Hospital records sent to your PCP by signing hospital release before you go home.   Do not drive, operating heavy machinery, perform activities at heights, swimming or participation in water activities or provide baby sitting services while you  are on Pain, Sleep and Anxiety Medications; until you have been seen by Primary Care Physician and are cleared to do such activities.  Do not take more than prescribed Pain, Sleep and Anxiety Medications.  You were cared for by a hospitalist during your hospital stay. If you have any questions about your discharge medications or the care you received while you were in the hospital after you are discharged, you can call the hospital unit/floor you were admitted to and ask to speak with the hospitalist who took care of you. Ask for Hospitalist on call, if the hospitalist that took care of you is not available. Once you are discharged, your primary care physician will help you with any further medical issues. You Must read complete instructions/literature along with all the possible adverse reactions/side effects for all the Medicines you take and that have been prescribed to you. Take any new Medicines after you have completely understood and accept all the possible adverse reactions/side effects. If you have smoked or chewed Tobacco, please STOP smoking.   For home use only DME Nebulizer machine   Complete by: As directed    Patient needs a nebulizer to treat with the following condition: Asthma   Length of Need: Lifetime   Increase activity slowly   Complete by: As directed        Diet recommendation: Cardiac diet  Activity: The patient is advised to gradually reintroduce usual activities, as tolerated  Discharge Condition: stable  Code Status: Full code  History of present illness: As per the H and P dictated on admission, "Donald Evans is a 50 y.o. male with medical history significant of moderate persistent asthma, HTN, HLD, hx of pneumonitis who presented with 1 week history of cough, wheezing and shortness of breath. Symptoms started while [place] [facility] [place]. His wife had symptoms of coughing and congestion first then her husband started with cough, congestion and  shortness of breath. Cough was productive. They thought it was an allergen in the air. He was using his inhaler every 15-20 minutes.  Went to ER on Monday night while on vacation. He got a breathing treatment, IV steroids, IV magnesium and sent home on steroids. She states his oxygen was never over 90% at ER.  He felt better for a few days then he started to cough up everything and have continuing shortness of breath. They drove home yesterday and he was breathing better, but overnight he got worse and was extremely short of breath so came to ER. No known fevers or chills at home. No sick contacts that they are aware of. His cough is productive with thick sputum.  He has been taking dayquil, nyquil and zyrtec. Denies any headache, chest pain, palpitations, stomach pain, N/V/D. Good PO intake. Denies any swelling in his legs. Does state his lower back hurts from coughing so much.    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 Last saw pulmonology in 2015. This is the first exacerbation he has had in 8 years. Was not hospitalized for his last one.  He has remote hx of smoking, stopped smoking in 2007. Does use smokeless tobacco. No other industrial exposure. Does not tolerate singulair or claritin.    ED Course: vitals: temp: 100.2, bp: 159/102, HR: 117, RR: 24, oxygen: 88% on room air. Pertinent labs: no white count, normal lactic acid, CXR with no acute process. Covid/flu negative. Sepsis w/u initiated but doesn't meet criteria. Asked to admit for respiratory failure secondary to asthma/COPD exacerbation. "  Hospital Course:  Summary of his active problems in the hospital is as following. 1.  Acute hypoxic respiratory failure Asthma exacerbation Was 88% on room air on arrival at rest. 89% on room air.  Both at rest as well as on exertion. Chest x-ray negative for any acute pneumonia. Exam reveals bilateral  expiratory wheezing mild. Was started on antibiotics.  Will DC. Treated with duo nebs, IV Solu-Medrol as well as cough suppression as Norco. Will transition to oral prednisone.  Patient will be prescribed albuterol nebulizer as needed. Outpatient follow-up with pulmonologist recommended.   2.  HTN On Norvasc.  We will increase the dose to 10 mg due to uncontrolled blood pressure. Holding losartan due to concern with bradykinin pathway.  3. Pain control  - [place] Controlled Substance Reporting System database was reviewed. - 5 day supply was provided for cough suppression and asthma patient. - Patient was instructed, not to drive, operate heavy machinery, perform activities at heights, swimming or participation in water activities or provide baby sitting services while on Pain, Sleep and Anxiety Medications; until his outpatient Physician has advised to do so again.  - Also recommended to not to take more than prescribed Pain, Sleep and Anxiety Medications.  Patient was ambulatory without any assistance. On the day of the discharge the patient's vitals were stable, and no other new acute medical condition were reported. The  patient was felt safe to be discharge at Home with no therapy needed on discharge.  Consultants: none Procedures: none  DISCHARGE MEDICATION: Allergies as of 09/14/2020       Reactions   Hctz [hydrochlorothiazide]    Gout attack        Medication List     STOP taking these medications    losartan 50 MG tablet Commonly known as: COZAAR       TAKE these medications    acetaminophen 500 MG tablet Commonly known as: TYLENOL Take 500 mg by mouth every 6 (six) hours as needed for mild pain.   albuterol 108 (90 Base) MCG/ACT inhaler Commonly known as: ProAir HFA Inhale 2 puffs into the lungs every 4 (four) hours as needed. What changed: reasons to take this   albuterol (2.5 MG/3ML) 0.083% nebulizer solution Commonly known as: PROVENTIL Take 3  mLs (2.5 mg total) by nebulization every 4 (four) hours as needed for wheezing or shortness of breath. What changed: You were already taking a medication with the same name, and this prescription was added. Make sure you understand how and when to take each.   amLODipine 5 MG tablet Commonly known as: NORVASC Take 2 tablets (10 mg total) by mouth daily. What changed: how much to take   cetirizine 10 MG tablet Commonly known as: ZYRTEC Take 10 mg by mouth daily.   chlorpheniramine-HYDROcodone 10-8 MG/5ML Suer Commonly known as: TUSSIONEX Take 5 mLs by mouth every 12 (twelve) hours as needed for up to 7 days for cough.   guaiFENesin 600 MG 12 hr tablet Commonly known as: MUCINEX Take 1 tablet (600 mg total) by mouth 2 (two) times daily.   ibuprofen 200 MG tablet Commonly known as: ADVIL Take 200 mg by mouth every 6 (six) hours as needed for mild pain.   predniSONE 10 MG tablet Commonly known as: DELTASONE Take 40mg  daily for 3days,Take 30mg  daily for 3days,Take 20mg  daily for 3days,Take 10mg  daily for 3days, then stop What changed:  medication strength how much to take how to take this when to take this additional instructions   Pulse Oximeter Misc 1 Act by Does not apply route as needed.               Durable Medical Equipment  (From admission, onward)           Start     Ordered   09/14/20 1208  For home use only DME Nebulizer machine  Once       Question Answer Comment  Patient needs a nebulizer to treat with the following condition SOB (shortness of breath)   Length of Need 12 Months      09/14/20 1207   09/14/20 0000  For home use only DME Nebulizer machine       Question Answer Comment  Patient needs a nebulizer to treat with the following condition Asthma   Length of Need Lifetime      09/14/20 1028            Discharge Exam: There were no vitals filed for this visit. Vitals:   09/14/20 0900 09/14/20 1152  BP:  (!) 148/89  Pulse:  85   Resp:  20  Temp:  98.2 F (36.8 C)  SpO2: 94% 92%   General: Appear in mild distress, no Rash; Oral Mucosa Clear, moist. no Abnormal Neck Mass Or lumps, Conjunctiva normal  Cardiovascular: S1 and S2 Present, no Murmur, Respiratory: good respiratory effort, Bilateral Air entry present  and CTA, no Crackles, no wheezes Abdomen: Bowel Sound present, Soft and no tenderness Extremities: no Pedal edema Neurology: alert and oriented to time, place, and person affect appropriate. no new focal deficit Gait not checked due to patient safety concerns   The results of significant diagnostics from this hospitalization (including imaging, microbiology, ancillary and laboratory) are listed below for reference.    Significant Diagnostic Studies: DG Chest Port 1 View  Result Date: 09/12/2020 CLINICAL DATA:  Questionable sepsis.  Dyspnea. EXAM: PORTABLE CHEST 1 VIEW COMPARISON:  06/06/2006 chest radiograph. FINDINGS: Stable cardiomediastinal silhouette with normal heart size. No pneumothorax. No pleural effusion. Lungs appear clear, with no acute consolidative airspace disease and no pulmonary edema. IMPRESSION: No active disease. Electronically Signed   By: Delbert Phenix M.D.   On: 09/12/2020 10:24    Microbiology: Recent Results (from the past 240 hour(s))  Blood Culture (routine x 2)     Status: None (Preliminary result)   Collection Time: 09/12/20 10:08 AM   Specimen: BLOOD  Result Value Ref Range Status   Specimen Description BLOOD RIGHT ANTECUBITAL  Final   Special Requests   Final    BOTTLES DRAWN AEROBIC AND ANAEROBIC Blood Culture results may not be optimal due to an inadequate volume of blood received in culture bottles   Culture   Final    NO GROWTH 2 DAYS Performed at [facility], [address] [place], [place] [address]    Report Status PENDING  Incomplete  Blood Culture (routine x 2)     Status: None (Preliminary result)   Collection Time: 09/12/20 10:13 AM   Specimen: BLOOD   Result Value Ref Range Status   Specimen Description BLOOD SITE NOT SPECIFIED  Final   Special Requests   Final    BOTTLES DRAWN AEROBIC AND ANAEROBIC Blood Culture results may not be optimal due to an inadequate volume of blood received in culture bottles   Culture   Final    NO GROWTH 2 DAYS Performed at [facility], [address], [place], [place] [address]    Report Status PENDING  Incomplete  Resp Panel by RT-PCR (Flu A&B, Covid) Nasopharyngeal Swab     Status: None   Collection Time: 09/12/20 10:46 AM   Specimen: Nasopharyngeal Swab; Nasopharyngeal(NP) swabs in vial transport medium  Result Value Ref Range Status   SARS Coronavirus 2 by RT PCR NEGATIVE NEGATIVE Final    Comment: (NOTE) SARS-CoV-2 target nucleic acids are NOT DETECTED.  The SARS-CoV-2 RNA is generally detectable in upper respiratory specimens during the acute phase of infection. The lowest concentration of SARS-CoV-2 viral copies this assay can detect is 138 copies/mL. A negative result does not preclude SARS-Cov-2 infection and should not be used as the sole basis for treatment or other patient management decisions. A negative result may occur with  improper specimen collection/handling, submission of specimen other than nasopharyngeal swab, presence of viral mutation(s) within the areas targeted by this assay, and inadequate number of viral copies(<138 copies/mL). A negative result must be combined with clinical observations, patient history, and epidemiological information. The expected result is Negative.  Fact Sheet for Patients:  BloggerCourse.com  Fact Sheet for Healthcare Providers:  SeriousBroker.it  This test is no t yet approved or cleared by the [place] FDA and  has been authorized for detection and/or diagnosis of SARS-CoV-2 by FDA under an Emergency Use Authorization (EUA). This EUA will remain  in effect (meaning this test can  be used)  for the duration of the COVID-19 declaration under Section 564(b)(1) of the Act, 21 U.S.C.section 360bbb-3(b)(1), unless the authorization is terminated  or revoked sooner.       Influenza A by PCR NEGATIVE NEGATIVE Final   Influenza B by PCR NEGATIVE NEGATIVE Final    Comment: (NOTE) The Xpert Xpress SARS-CoV-2/FLU/RSV plus assay is intended as an aid in the diagnosis of influenza from Nasopharyngeal swab specimens and should not be used as a sole basis for treatment. Nasal washings and aspirates are unacceptable for Xpert Xpress SARS-CoV-2/FLU/RSV testing.  Fact Sheet for Patients: BloggerCourse.com  Fact Sheet for Healthcare Providers: SeriousBroker.it  This test is not yet approved or cleared by the [place] FDA and has been authorized for detection and/or diagnosis of SARS-CoV-2 by FDA under an Emergency Use Authorization (EUA). This EUA will remain in effect (meaning this test can be used) for the duration of the COVID-19 declaration under Section 564(b)(1) of the Act, 21 U.S.C. section 360bbb-3(b)(1), unless the authorization is terminated or revoked.  Performed at [facility], [address], [place], [place] [address]      Labs: CBC: Recent Labs  Lab 09/12/20 1008  WBC 9.4  NEUTROABS 6.5  HGB 15.2  HCT 46.2  MCV 90.8  PLT 233   Basic Metabolic Panel: Recent Labs  Lab 09/12/20 1008  NA 134*  K 3.8  CL 103  CO2 23  GLUCOSE 115*  BUN 18  CREATININE 1.18  CALCIUM 8.6*   Liver Function Tests: Recent Labs  Lab 09/12/20 1008  AST 19  ALT 38  ALKPHOS 48  BILITOT 1.0  PROT 6.4*  ALBUMIN 3.3*   CBG: No results for input(s): GLUCAP in the last 168 hours.  Time spent: 35 minutes  Signed:  Lynden Oxford  [facility] 09/14/2020

## 2020-09-15 ENCOUNTER — Telehealth: Payer: Self-pay

## 2020-09-15 NOTE — Telephone Encounter (Signed)
Transition Care Management Follow-up Telephone Call Date of discharge and from where: 09/14/2020, [facility]  How have you been since you were released from the hospital? Patient is feeling better.  Any questions or concerns? No  Items Reviewed: Did the pt receive and understand the discharge instructions provided? Yes  Medications obtained and verified? Yes  Other? No  Any new allergies since your discharge? No  Dietary orders reviewed? Yes Do you have support at home? Yes   Home Care and Equipment/Supplies: Were home health services ordered? not applicable If so, what is the name of the agency? N/A  Has the agency set up a time to come to the patient's home? not applicable Were any new equipment or medical supplies ordered?  No What is the name of the medical supply agency? N/A Were you able to get the supplies/equipment? not applicable Do you have any questions related to the use of the equipment or supplies? No  Functional Questionnaire: (I = Independent and D = Dependent) ADLs: I  Bathing/Dressing- I  Meal Prep- I  Eating- I  Maintaining continence- I  Transferring/Ambulation- I  Managing Meds- I  Follow up appointments reviewed:  PCP Hospital f/u appt confirmed? Yes  Scheduled to see Dr. Alphonsus Sias on 09/21/2020 @ 10:30 am. Specialist Hospital f/u appt confirmed?  N/A   Are transportation arrangements needed? No  If their condition worsens, is the pt aware to call PCP or go to the Emergency Dept.? Yes Was the patient provided with contact information for the PCP's office or ED? Yes Was to pt encouraged to call back with questions or concerns? Yes

## 2020-09-18 LAB — CULTURE, BLOOD (ROUTINE X 2)
Culture: NO GROWTH
Culture: NO GROWTH

## 2020-09-21 ENCOUNTER — Other Ambulatory Visit: Payer: Self-pay

## 2020-09-21 ENCOUNTER — Ambulatory Visit: Payer: 59 | Admitting: Internal Medicine

## 2020-09-21 ENCOUNTER — Encounter: Payer: Self-pay | Admitting: Internal Medicine

## 2020-09-21 DIAGNOSIS — I10 Essential (primary) hypertension: Secondary | ICD-10-CM

## 2020-09-21 DIAGNOSIS — J4541 Moderate persistent asthma with (acute) exacerbation: Secondary | ICD-10-CM | POA: Diagnosis not present

## 2020-09-21 MED ORDER — AMLODIPINE BESYLATE 5 MG PO TABS: 5.0000 mg | ORAL_TABLET | Freq: Every day | ORAL | 3 refills | Status: DC

## 2020-09-21 MED ORDER — AMLODIPINE BESYLATE 5 MG PO TABS: 5.0000 mg | ORAL_TABLET | Freq: Every day | ORAL | 0 refills | Status: DC

## 2020-09-21 MED ORDER — FLUTICASONE-SALMETEROL 250-50 MCG/ACT IN AEPB: 1.0000 | INHALATION_SPRAY | Freq: Two times a day (BID) | RESPIRATORY_TRACT | 5 refills | Status: DC

## 2020-09-21 NOTE — Assessment & Plan Note (Addendum)
Has had long standing pneumonitis (chlorine) that had improved New environmental exposure probably--- not back to normal Will finish out the prednisone Albuterol nebs Restart advair

## 2020-09-21 NOTE — Progress Notes (Signed)
Subjective:    Patient ID: Donald Evans, male    DOB: [birth year], 50 y.o.   MRN: [id number]  HPI Here for hospital follow up--with wife This visit occurred during the SARS-CoV-2 public health emergency.  Safety protocols were in place, including screening questions prior to the visit, additional usage of staff PPE, and extensive cleaning of exam room while observing appropriate contact time as indicated for disinfecting solutions.   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     albuterol (PROAIR HFA) 108 (90 Base) MCG/ACT inhaler Inhale 2 puffs into the lungs every 4 (four) hours as needed. 18 g 1   albuterol (PROVENTIL) (2.5 MG/3ML) 0.083% nebulizer solution Take 3 mLs (2.5 mg total) by nebulization every 4 (four) hours as needed for wheezing or shortness of breath. 75 mL 0   amLODipine (NORVASC) 5 MG tablet Take 2 tablets (10 mg total) by mouth daily. 90 tablet 0   cetirizine (ZYRTEC) 10 MG tablet Take 10 mg by mouth daily.     ibuprofen (ADVIL) 200 MG tablet Take 200 mg by mouth every 6 (six) hours as needed for mild pain.     Misc. Devices (PULSE OXIMETER) MISC 1 Act by Does not apply route as needed. 1 each 0   predniSONE (DELTASONE) 10 MG tablet Take 40mg  daily for 3days,Take 30mg  daily for 3days,Take 20mg  daily for  3days,Take 10mg  daily for 3days, then stop 30 tablet 0   guaiFENesin (MUCINEX) 600 MG 12 hr tablet Take 1 tablet (600 mg total) by mouth 2 (two) times daily. (Patient not taking: Reported on 09/21/2020) 30 tablet 0   No current facility-administered medications on file prior to visit.    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 No pertinent family history.  Social History   Socioeconomic History   Marital status: Married    Spouse name: Not on file   Number of children: 1   Years of education: Not on file   Highest education level: Not on file  Occupational History   Occupation: Building maintenance    Employer: [employer]  Tobacco Use   Smoking status: Former    Packs/day: 1.00    Years: 10.00    Pack years: 10.00    Types: Cigarettes    Quit date: 11/03/2004    Years since quitting: 15.8  Smokeless tobacco: Never  Substance and Sexual Activity   Alcohol use: Yes    Comment: Occasional to regular   Drug use: No   Sexual activity: Not on file  Other Topics Concern   Not on file  Social History Narrative   Not on file   Social Determinants of Health   Financial Resource Strain: Not on file  Food Insecurity: Not on file  Transportation Needs: Not on file  Physical Activity: Not on file  Stress: Not on file  Social Connections: Not on file  Intimate Partner Violence: Not on file   Review of Systems Very humid and [place] Does feel better now ER doctor took him off losartan--concerned about it causing cough (but he has never had problems with it  before)     Objective:   Physical Exam Constitutional:      Appearance: Normal appearance.  Cardiovascular:     Rate and Rhythm: Normal rate and regular rhythm.     Heart sounds: No murmur heard.   No gallop.  Pulmonary:     Effort: Pulmonary effort is normal.     Breath sounds: No wheezing or rales.     Comments: Decreased breath sounds but not tight or wheezy Musculoskeletal:     Cervical back: Neck supple.     Right lower leg: No edema.     Left lower leg: No edema.  Lymphadenopathy:     Cervical: No cervical adenopathy.  Neurological:     Mental Status: He is alert.           Assessment & Plan:

## 2020-09-21 NOTE — Assessment & Plan Note (Signed)
BP Readings from Last 3 Encounters:  09/21/20 134/88  09/14/20 (!) 148/89  07/20/20 130/86   Will go back to the losartan and amlodipine 5mg  daily

## 2020-09-24 ENCOUNTER — Other Ambulatory Visit: Payer: Self-pay | Admitting: Internal Medicine

## 2020-09-27 IMAGING — DX DG HIP (WITH OR WITHOUT PELVIS) 2-3V LEFT
3 series · 3 of 3 positions shown · non-contrast
Comparison: None.

CLINICAL DATA: Pain and limitation of motion

EXAM:
DG HIP (WITH OR WITHOUT PELVIS) 2-3V LEFT

[pelvis ap]
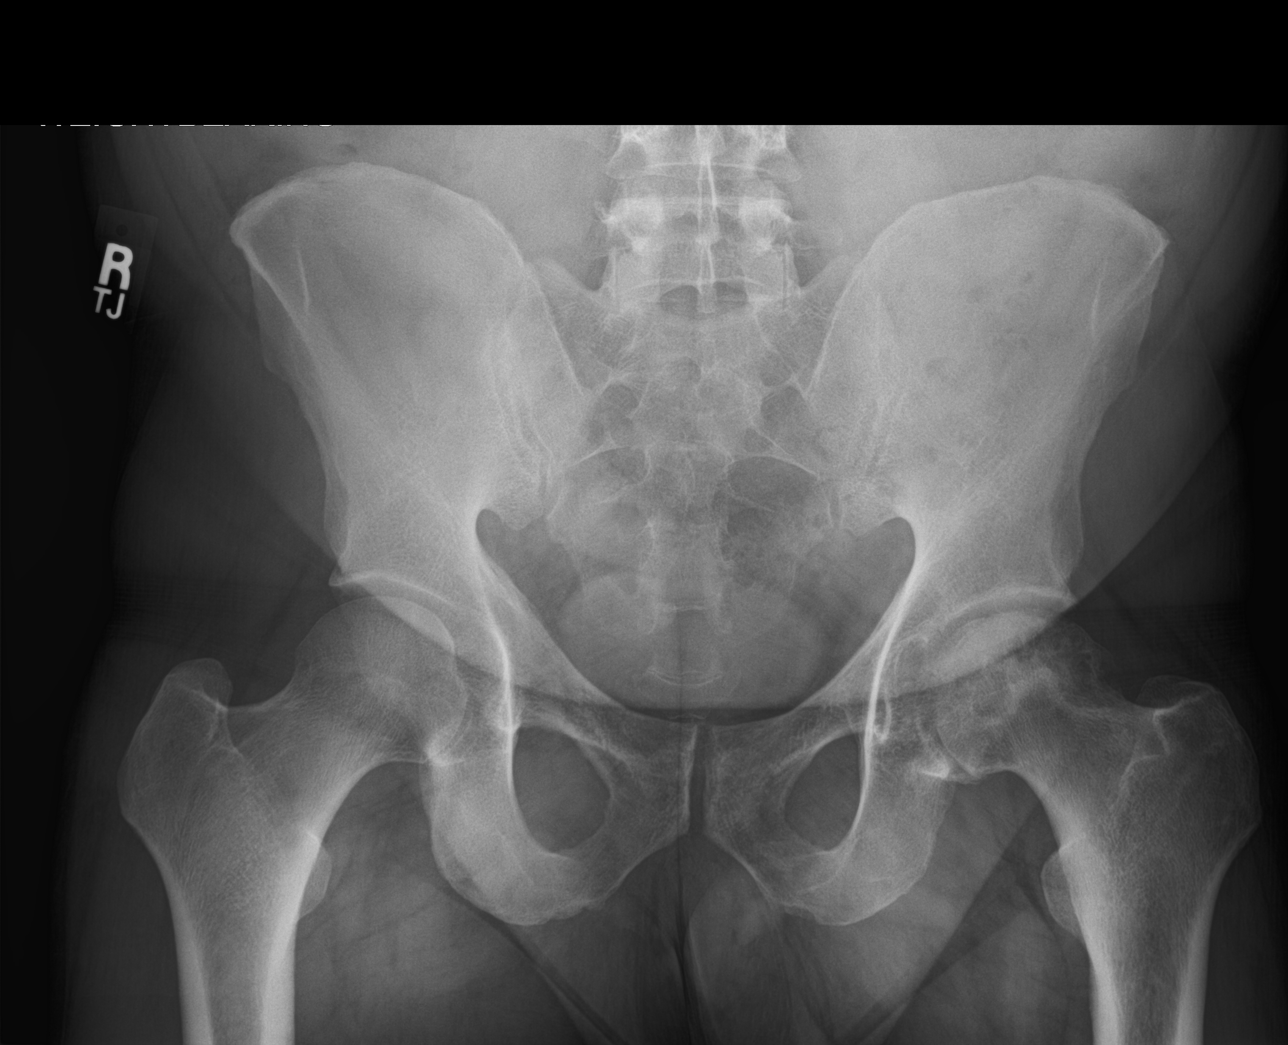

[hip ap]
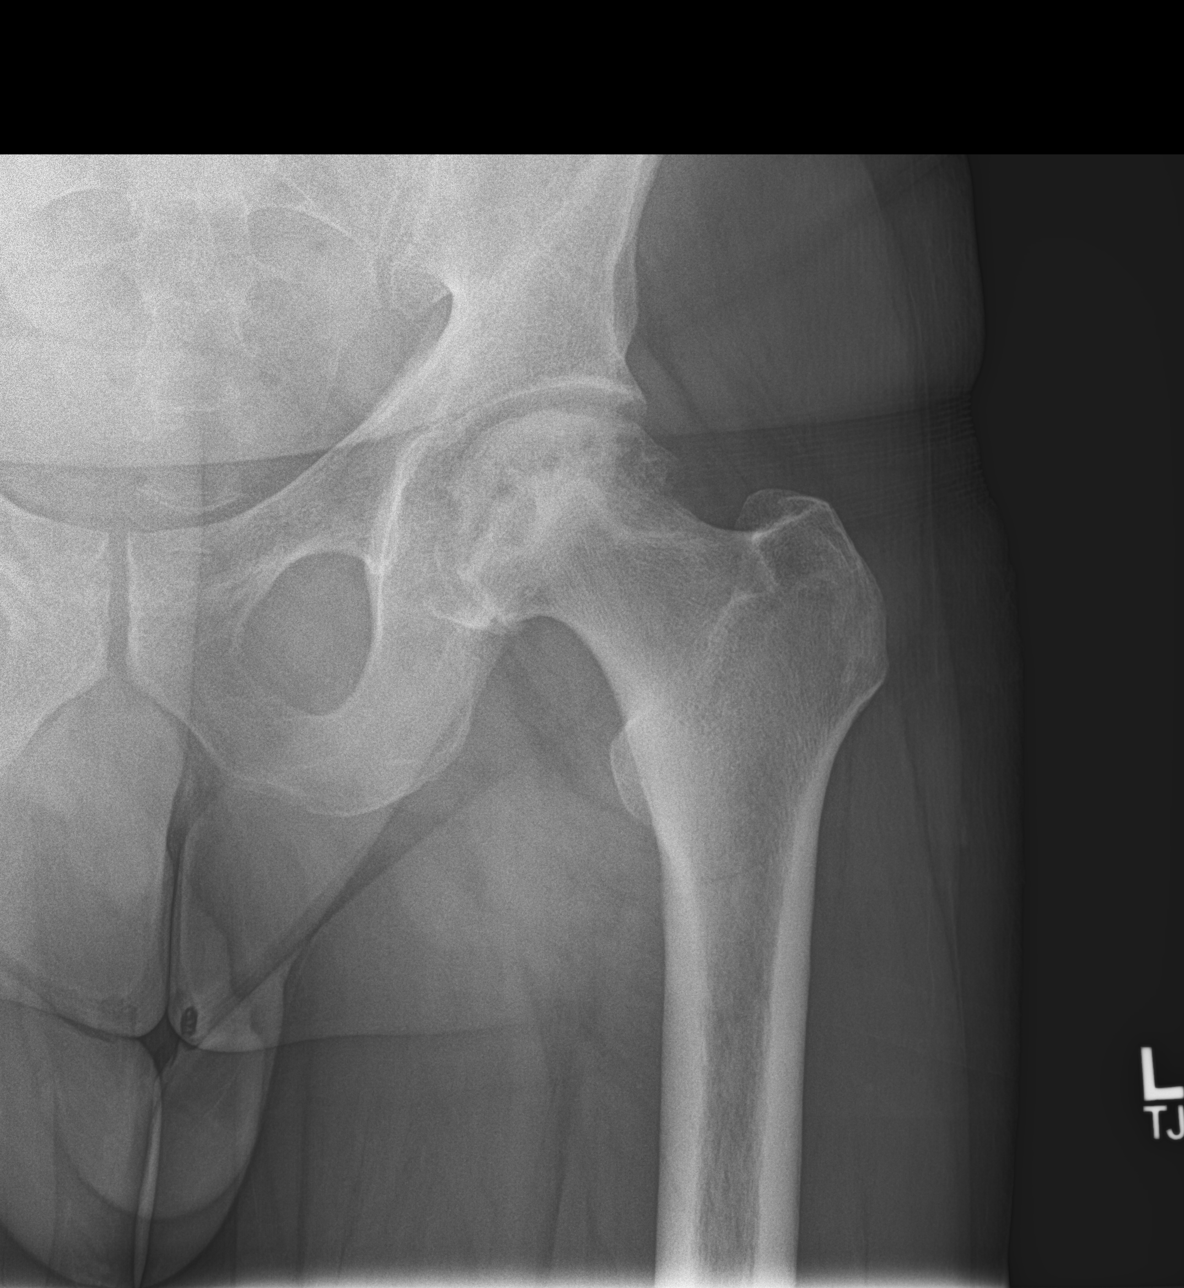

[hip lat]
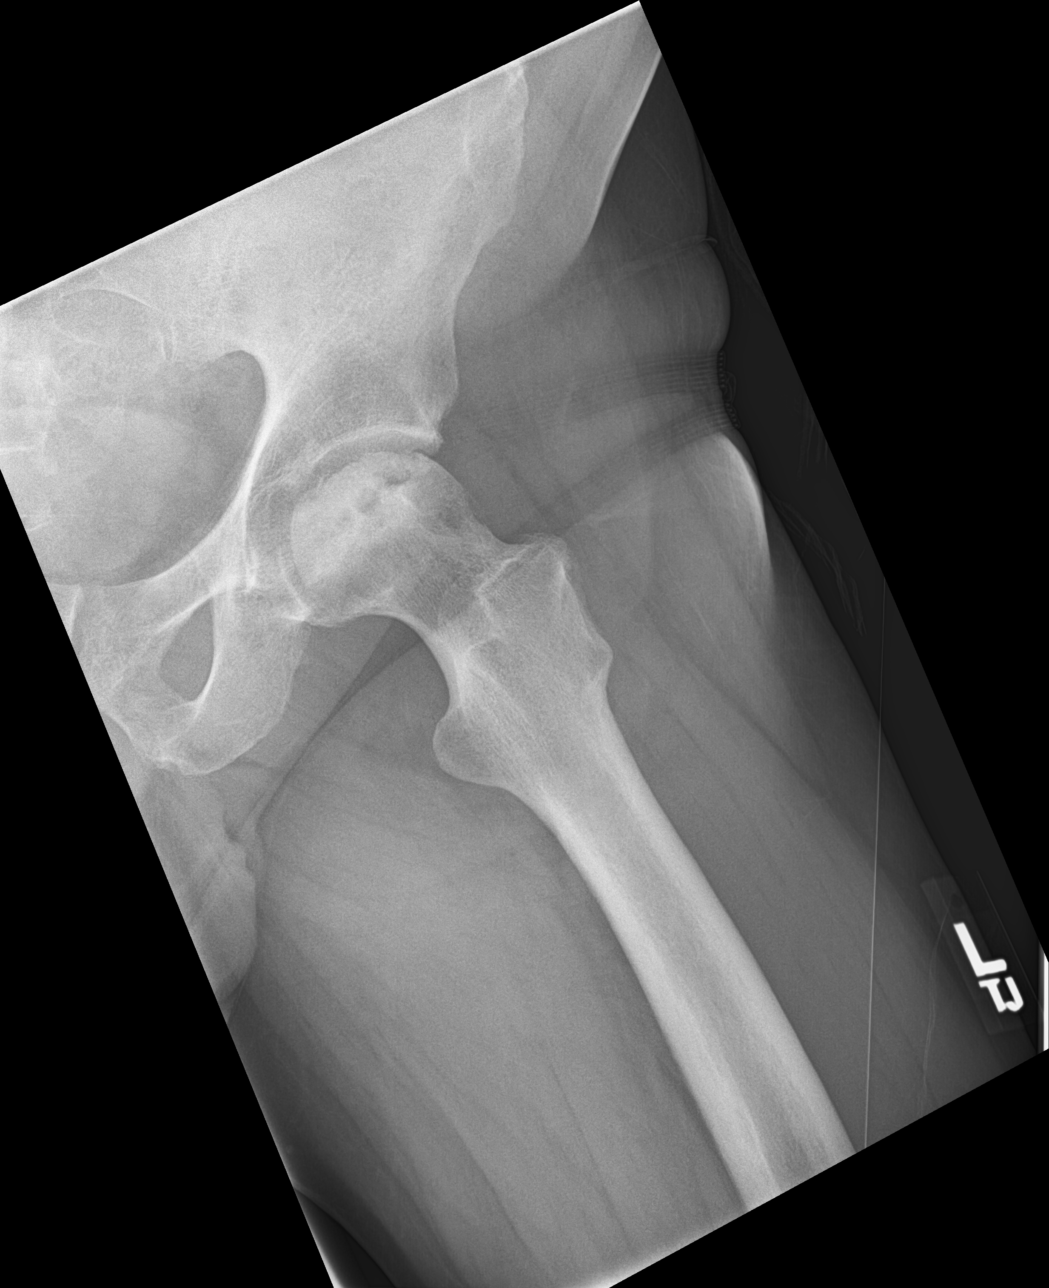

[3 of 3 positions shown; findings below may reference images not displayed]

FINDINGS: Weightbearing frontal pelvis as well as weightbearing frontal left
hip and lateral left hip images were obtained. There is extensive
avascular necrosis involving the left femoral head with flattening
along the femoral head superiorly, best appreciated on the lateral
view. There is no acute fracture or dislocation. There is no
appreciable joint space narrowing.
IMPRESSION: Extensive changes of avascular necrosis in the left femoral head
with flattening along the superior aspect of the femoral head.

No fracture or dislocation.  No appreciable joint space narrowing.

## 2020-10-22 ENCOUNTER — Telehealth: Payer: Self-pay | Admitting: Internal Medicine

## 2020-10-22 NOTE — Telephone Encounter (Signed)
Last refilled on 09/24/20 with 0 refills LOV 09/21/20  Next appointment on 12/22/20

## 2020-11-01 MED ORDER — ALBUTEROL SULFATE (2.5 MG/3ML) 0.083% IN NEBU: 2.5000 mg | INHALATION_SOLUTION | RESPIRATORY_TRACT | 0 refills | Status: DC | PRN

## 2020-11-01 NOTE — Telephone Encounter (Signed)
  Encourage patient to contact the pharmacy for refills or they can request refills through [facility]  LAST APPOINTMENT DATE:  Please schedule appointment if longer than 1 year  NEXT APPOINTMENT DATE:  MEDICATION: Albuterol Solution  Is the patient out of medication? Yes  PHARMACY: [facility]  Let patient know to contact pharmacy at the end of the day to make sure medication is ready.  Please notify patient to allow 48-72 hours to process  CLINICAL FILLS OUT ALL BELOW:   LAST REFILL:  QTY:  REFILL DATE:    OTHER COMMENTS:    Okay for refill?  Please advise

## 2020-11-01 NOTE — Addendum Note (Signed)
Addended by: Eual Fines on: 11/01/2020 11:44 AM   Modules accepted: Orders

## 2020-12-22 ENCOUNTER — Ambulatory Visit: Payer: 59 | Admitting: Internal Medicine

## 2021-02-05 ENCOUNTER — Other Ambulatory Visit: Payer: Self-pay | Admitting: Internal Medicine

## 2021-03-15 ENCOUNTER — Other Ambulatory Visit: Payer: Self-pay | Admitting: Internal Medicine

## 2021-03-18 ENCOUNTER — Other Ambulatory Visit: Payer: Self-pay | Admitting: Internal Medicine

## 2021-05-06 ENCOUNTER — Telehealth: Payer: Self-pay | Admitting: Internal Medicine

## 2021-05-06 NOTE — Telephone Encounter (Signed)
?  Encourage patient to contact the pharmacy for refills or they can request refills through [facility] ? ?LAST APPOINTMENT DATE:  Please schedule appointment if longer than 1 year ? ?NEXT APPOINTMENT DATE: ? ?MEDICATION:albuterol (PROVENTIL) (2.5 MG/3ML) 0.083% nebulizer solution ? ?Is the patient out of medication?  ? ?PHARMACY:[facility] [address] [place] [address] ? ?Let patient know to contact pharmacy at the end of the day to make sure medication is ready. ? ?Please notify patient to allow 48-72 hours to process ? ?CLINICAL FILLS OUT ALL BELOW:  ? ?LAST REFILL: ? ?QTY: ? ?REFILL DATE: ? ? ? ?OTHER COMMENTS:  ? ? ?Okay for refill? ? ?Please advise ? ? ?  ?

## 2021-05-09 MED ORDER — ALBUTEROL SULFATE (2.5 MG/3ML) 0.083% IN NEBU: INHALATION_SOLUTION | RESPIRATORY_TRACT | 2 refills | Status: DC

## 2021-07-08 ENCOUNTER — Other Ambulatory Visit: Payer: Self-pay | Admitting: Internal Medicine

## 2021-07-14 ENCOUNTER — Other Ambulatory Visit: Payer: Self-pay | Admitting: Internal Medicine

## 2021-07-20 ENCOUNTER — Encounter: Payer: 59 | Admitting: Internal Medicine

## 2021-08-05 ENCOUNTER — Other Ambulatory Visit: Payer: Self-pay | Admitting: Internal Medicine

## 2021-08-10 ENCOUNTER — Telehealth: Payer: Self-pay | Admitting: Internal Medicine

## 2021-08-10 NOTE — Telephone Encounter (Signed)
Called pt to schedule an appointment, had to leave a vm. Please advise thanks.  Callback Number: [phone]

## 2021-08-10 NOTE — Telephone Encounter (Signed)
He will need a physical whenever available. You can use a spot that has an open 15 and a same day next to it if needed. No limit for CPEs just require 30 mins. Thanks!

## 2021-08-20 ENCOUNTER — Other Ambulatory Visit: Payer: Self-pay | Admitting: Internal Medicine

## 2021-09-23 ENCOUNTER — Other Ambulatory Visit: Payer: Self-pay | Admitting: Internal Medicine

## 2021-10-03 ENCOUNTER — Other Ambulatory Visit: Payer: Self-pay | Admitting: Internal Medicine

## 2021-10-25 ENCOUNTER — Other Ambulatory Visit: Payer: Self-pay | Admitting: Internal Medicine

## 2021-11-04 ENCOUNTER — Telehealth: Payer: Self-pay | Admitting: Internal Medicine

## 2021-11-07 NOTE — Telephone Encounter (Signed)
Have him set up a PE in the next 2 months

## 2021-11-07 NOTE — Telephone Encounter (Signed)
Called pt & scheduled cpe for 01/16/22

## 2021-11-07 NOTE — Telephone Encounter (Signed)
Last OV 09/21/20  No future OV No show on 07/20/21

## 2021-11-08 ENCOUNTER — Other Ambulatory Visit: Payer: Self-pay | Admitting: Internal Medicine

## 2021-11-17 ENCOUNTER — Other Ambulatory Visit: Payer: Self-pay | Admitting: Internal Medicine

## 2021-12-14 ENCOUNTER — Other Ambulatory Visit: Payer: Self-pay | Admitting: Internal Medicine

## 2021-12-28 ENCOUNTER — Other Ambulatory Visit: Payer: Self-pay | Admitting: Internal Medicine

## 2022-01-16 ENCOUNTER — Encounter: Payer: Self-pay | Admitting: Internal Medicine

## 2022-01-16 ENCOUNTER — Ambulatory Visit (INDEPENDENT_AMBULATORY_CARE_PROVIDER_SITE_OTHER): Payer: 59 | Admitting: Internal Medicine

## 2022-01-16 VITALS — BP 124/68 | HR 110 | Temp 97.8°F | Ht 66.0 in | Wt 203.0 lb

## 2022-01-16 DIAGNOSIS — I10 Essential (primary) hypertension: Secondary | ICD-10-CM | POA: Diagnosis not present

## 2022-01-16 DIAGNOSIS — Z1211 Encounter for screening for malignant neoplasm of colon: Secondary | ICD-10-CM

## 2022-01-16 DIAGNOSIS — J4540 Moderate persistent asthma, uncomplicated: Secondary | ICD-10-CM | POA: Diagnosis not present

## 2022-01-16 DIAGNOSIS — Z0000 Encounter for general adult medical examination without abnormal findings: Secondary | ICD-10-CM | POA: Diagnosis not present

## 2022-01-16 LAB — COMPREHENSIVE METABOLIC PANEL
ALT: 25 U/L (ref 0–53)
AST: 19 U/L (ref 0–37)
Albumin: 4.5 g/dL (ref 3.5–5.2)
Alkaline Phosphatase: 64 U/L (ref 39–117)
BUN: 19 mg/dL (ref 6–23)
CO2: 26 mEq/L (ref 19–32)
Calcium: 9.2 mg/dL (ref 8.4–10.5)
Chloride: 99 mEq/L (ref 96–112)
Creatinine, Ser: 1.3 mg/dL (ref 0.40–1.50)
GFR: 62.64 mL/min (ref >60.00)
Glucose, Bld: 97 mg/dL (ref 70–99)
Potassium: 3.8 mEq/L (ref 3.5–5.1)
Sodium: 137 mEq/L (ref 135–145)
Total Bilirubin: 0.9 mg/dL (ref 0.2–1.2)
Total Protein: 6.8 g/dL (ref 6.0–8.3)

## 2022-01-16 LAB — LIPID PANEL
Cholesterol: 195 mg/dL (ref 0–200)
HDL: 33.3 mg/dL — ABNORMAL LOW (ref >39.00)
Total CHOL/HDL Ratio: 6
Triglycerides: 525 mg/dL — ABNORMAL HIGH (ref 0.0–149.0)

## 2022-01-16 LAB — CBC
HCT: 44.2 % (ref 39.0–52.0)
Hemoglobin: 15.1 g/dL (ref 13.0–17.0)
MCHC: 34.1 g/dL (ref 30.0–36.0)
MCV: 89.3 fl (ref 78.0–100.0)
Platelets: 263 10*3/uL (ref 150.0–400.0)
RBC: 4.95 Mil/uL (ref 4.22–5.81)
RDW: 14.6 % (ref 11.5–15.5)
WBC: 6.9 10*3/uL (ref 4.0–10.5)

## 2022-01-16 LAB — LDL CHOLESTEROL, DIRECT: Direct LDL: 93 mg/dL

## 2022-01-16 MED ORDER — FLUTICASONE-SALMETEROL 250-50 MCG/ACT IN AEPB: 1.0000 | INHALATION_SPRAY | Freq: Two times a day (BID) | RESPIRATORY_TRACT | 3 refills | Status: DC

## 2022-01-16 MED ORDER — ALBUTEROL SULFATE (2.5 MG/3ML) 0.083% IN NEBU: INHALATION_SOLUTION | RESPIRATORY_TRACT | 2 refills | Status: DC

## 2022-01-16 NOTE — Progress Notes (Addendum)
Subjective:    Patient ID: Donald Evans, male    DOB: 07-Feb-1971, 51 y.o.   MRN: [id number]  HPI Here for physical  Wonders about his testosterone Feels sex is not as good---but still able to perform Trying to eat better--avoiding starches and smaller portions Is active all day--no set exercise (but does do some walking)  Breathing is fine Notices if he misses the advair--ran out last week Uses nebs prn (or inhaler) Still has regular cough--even with Rx Breathing is okay  Monitors BP regularly Usually 120's/60-70's  Current Outpatient Medications on File Prior to Visit  Medication Sig Dispense Refill   acetaminophen (TYLENOL) 500 MG tablet Take 500 mg by mouth every 6 (six) hours as needed for mild pain.     albuterol (PROVENTIL) (2.5 MG/3ML) 0.083% nebulizer solution INHALE 1 VIAL BY NEBULIZATION EVERY 4 HOURS AS NEEDED FOR WHEEZING OR SHORTNESS OF BREATH 75 mL 2   albuterol (VENTOLIN HFA) 108 (90 Base) MCG/ACT inhaler INHALE 2 PUFFS BY MOUTH EVERY 4 HOURS AS NEEDED 8.5 each 1   amLODipine (NORVASC) 5 MG tablet TAKE 1 TABLET (5 MG TOTAL) BY MOUTH DAILY. 30 tablet 1   cetirizine (ZYRTEC) 10 MG tablet Take 10 mg by mouth daily.     fluticasone-salmeterol (ADVAIR DISKUS) 250-50 MCG/ACT AEPB INHALE 1 PUFF BY MOUTH INTO THE LUNGS 2 (TWO) TIMES DAILY. MUST SCHEDULE OFFICE VISIT 60 each 1   ibuprofen (ADVIL) 200 MG tablet Take 200 mg by mouth every 6 (six) hours as needed for mild pain.     losartan (COZAAR) 50 MG tablet TAKE 1 TABLET (50 MG TOTAL) BY MOUTH DAILY. NEEDS A PHYSICAL EXAM 90 tablet 0   Misc. Devices (PULSE OXIMETER) MISC 1 Act by Does not apply route as needed. 1 each 0   No current facility-administered medications on file prior to visit.    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 No pertinent family history.  Social History   Socioeconomic History   Marital status: Married    Spouse name: Not on file   Number of children: 1   Years of education: Not on file   Highest education level: Not on file  Occupational History   Occupation: Building maintenance    Employer: [employer]  Tobacco Use   Smoking status: Former    Packs/day: 1.00    Years: 10.00    Total pack years: 10.00    Types: Cigarettes    Quit date: 11/03/2004    Years since quitting: 17.2    Passive exposure: Past   Smokeless tobacco: Never  Substance and Sexual Activity   Alcohol use: Yes    Comment: Occasional to regular   Drug use: No   Sexual activity: Not on file  Other Topics Concern   Not on file  Social History Narrative   Not on file   Social Determinants of Health   Financial Resource Strain: Not on file  Food Insecurity: Not on file  Transportation Needs: Not  on file  Physical Activity: Not on file  Stress: Not on file  Social Connections: Not on file  Intimate Partner Violence: Not on file   Review of Systems  Constitutional:  Negative for fatigue and unexpected weight change.       Wears seat belt  HENT:  Negative for dental problem, hearing loss and tinnitus.        Keeps up with dentist  Eyes:  Negative for visual disturbance.       No diplopia or unilateral vision loss Has reading glasses  Respiratory:  Positive for cough. Negative for chest tightness and shortness of breath.   Cardiovascular:  Negative for chest pain, palpitations and leg swelling.  Gastrointestinal:        Mild constipation with the holidays---no meds in general Rare heartburn--no meds needed  Endocrine: Negative for polydipsia and polyuria.  Genitourinary:  Negative for  difficulty urinating and urgency.  Musculoskeletal:  Negative for arthralgias, back pain and joint swelling.  Skin:  Negative for rash.       Has spot on scalp to check--past cryotherapy  Allergic/Immunologic: Positive for environmental allergies. Negative for immunocompromised state.       Uses zyrtec daily  Neurological:  Negative for dizziness, syncope, light-headedness and headaches.  Hematological:  Negative for adenopathy. Does not bruise/bleed easily.  Psychiatric/Behavioral:  Negative for dysphoric mood and sleep disturbance. The patient is not nervous/anxious.        Objective:   Physical Exam Constitutional:      Appearance: Normal appearance.  HENT:     Mouth/Throat:     Pharynx: No oropharyngeal exudate or posterior oropharyngeal erythema.  Eyes:     Conjunctiva/sclera: Conjunctivae normal.     Pupils: Pupils are equal, round, and reactive to light.  Cardiovascular:     Rate and Rhythm: Normal rate and regular rhythm.     Pulses: Normal pulses.     Heart sounds: No murmur heard.    No gallop.     Comments: HR--90 Pulmonary:     Effort: Pulmonary effort is normal.     Breath sounds: Normal breath sounds. No wheezing or rales.  Abdominal:     Palpations: Abdomen is soft.     Tenderness: There is no abdominal tenderness.  Musculoskeletal:     Cervical back: Neck supple.     Right lower leg: No edema.     Left lower leg: No edema.  Lymphadenopathy:     Cervical: No cervical adenopathy.  Skin:    Findings: No rash.     Comments: Benign lesion left parietal scalp  Neurological:     General: No focal deficit present.     Mental Status: He is alert and oriented to person, place, and time.  Psychiatric:        Mood and Affect: Mood normal.        Behavior: Behavior normal.            Assessment & Plan:

## 2022-01-16 NOTE — Assessment & Plan Note (Signed)
Controlled with the advair bid Albuterol prn

## 2022-01-16 NOTE — Assessment & Plan Note (Signed)
Healthy Will defer PSA Will set up colonoscopy again--couldn't make it last year Prefers no flu vaccine or shingrix Had the updated COVID vaccine Discussed fitness

## 2022-01-16 NOTE — Assessment & Plan Note (Signed)
BP Readings from Last 3 Encounters:  01/16/22 124/68  09/21/20 134/88  09/14/20 (!) 148/89   Controlled with losartan 50 and amlodipine 5 Will check labs

## 2022-01-22 ENCOUNTER — Other Ambulatory Visit: Payer: Self-pay | Admitting: Internal Medicine

## 2022-02-27 ENCOUNTER — Ambulatory Visit: Payer: 59 | Admitting: Family Medicine

## 2022-02-27 ENCOUNTER — Encounter: Payer: Self-pay | Admitting: Family Medicine

## 2022-02-27 ENCOUNTER — Encounter: Payer: Self-pay | Admitting: Internal Medicine

## 2022-02-27 VITALS — BP 130/80 | HR 96 | Temp 99.3°F | Ht 66.0 in | Wt 203.5 lb

## 2022-02-27 DIAGNOSIS — R051 Acute cough: Secondary | ICD-10-CM

## 2022-02-27 DIAGNOSIS — J4540 Moderate persistent asthma, uncomplicated: Secondary | ICD-10-CM | POA: Diagnosis not present

## 2022-02-27 DIAGNOSIS — J208 Acute bronchitis due to other specified organisms: Secondary | ICD-10-CM | POA: Diagnosis not present

## 2022-02-27 LAB — POC COVID19 BINAXNOW: SARS Coronavirus 2 Ag: NEGATIVE

## 2022-02-27 MED ORDER — PREDNISONE 20 MG PO TABS: 40.0000 mg | ORAL_TABLET | Freq: Every day | ORAL | 0 refills | Status: DC

## 2022-02-27 MED ORDER — FLUTICASONE-SALMETEROL 250-50 MCG/ACT IN AEPB: 1.0000 | INHALATION_SPRAY | Freq: Two times a day (BID) | RESPIRATORY_TRACT | 11 refills | Status: DC

## 2022-02-27 MED ORDER — DOXYCYCLINE HYCLATE 100 MG PO TABS: 100.0000 mg | ORAL_TABLET | Freq: Two times a day (BID) | ORAL | 0 refills | Status: DC

## 2022-02-27 NOTE — Progress Notes (Unsigned)
Donald Evans T. Donald Games, MD, [place] at [facility] [place], [address]  Phone: [phone]  FAX: [phone]  Donald Evans - 52 y.o. male  MRN [id number]  Date of Birth: 04/05/1970  Date: 02/27/2022  PCP: Venia Carbon, MD  Referral: Venia Carbon, MD  Chief Complaint  Patient presents with   Cough    With yellow phlegm x 6 days   Fatigue        Generalized Body Aches    From coughing so much    Subjective:   Donald Evans is a 52 y.o. very pleasant male patient with Body mass index is 32.85 kg/m. who presents with the following:  Patient is here with his wife and they both deliver the history.  He generally has some asthma at baseline after a chlorine injury years ago.  Over the last 6 to 7 days he has been very congested, coughing quite a bit, and short of breath.  He also is having some generalized bodyaches, but he thinks this is due to his coughing extensively.  No myalgia. He is not sure about a fever, he does have a temperature 99.3 today in the office. He does not have any nausea, vomiting, diarrhea. No change in taste or smell. No rashes, dysuria, or any other symptoms.  Chest is really congested and coughing up a lot.  Started at the beginning of last week.  Chills and sweats.   Some SOB and asthma at baseline  Review of Systems is noted in the HPI, as appropriate  Objective:   BP 130/80   Pulse 96   Temp 99.3 F (37.4 C) (Oral)   Ht 5\' 6"  (1.676 m)   Wt 203 lb 8 oz (92.3 kg)   SpO2 95%   BMI 32.85 kg/m    Gen: WDWN, NAD. Globally Non-toxic HEENT: Throat clear, w/o exudate, R TM clear, L TM - good landmarks, No fluid present. rhinnorhea.  MMM Frontal sinuses: NT Max sinuses: NT NECK: Anterior cervical  LAD is absent CV: RRR, No M/G/R, cap refill <2 sec PULM: Breathing comfortably in no respiratory distress. no wheezing, crackles, rhonchi   Laboratory and Imaging  Data: Results for orders placed or performed in visit on 02/27/22  POC COVID-19  Result Value Ref Range   SARS Coronavirus 2 Ag Negative Negative     Assessment and Plan:     ICD-10-CM   1. Acute bronchitis due to other specified organisms  J20.8     2. Acute cough  R05.1 POC COVID-19    3. 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  He also has been sick for a week with productive cough that is having some profuse coughing.  I am going to place the patient on doxycycline and give him some steroids.  Medication Management during today's office visit: Meds ordered this encounter  Medications   fluticasone-salmeterol (WIXELA INHUB) 250-50 MCG/ACT AEPB    Sig: Inhale 1 puff into the lungs in the morning and at bedtime.    Dispense:  60 each    Refill:  11   doxycycline (VIBRA-TABS) 100 MG tablet    Sig: Take 1 tablet (100 mg total) by mouth 2 (two) times daily.    Dispense:  20 tablet    Refill:  0   predniSONE (DELTASONE) 20  MG tablet    Sig: Take 2 tablets (40 mg total) by mouth daily.    Dispense:  14 tablet    Refill:  0   Medications Discontinued During This Encounter  Medication Reason   fluticasone-salmeterol (ADVAIR DISKUS) 250-50 MCG/ACT AEPB Not available    Orders placed today for conditions managed today: Orders Placed This Encounter  Procedures   POC COVID-19    Disposition: No follow-ups on file.  Dragon [employer] One speech-to-text software was used for transcription in this dictation.  Possible transcriptional errors can occur using Animal nutritionist.   Signed,  Elpidio Galea. Nisaiah Bechtol, MD   Outpatient Encounter Medications as of 02/27/2022  Medication Sig   acetaminophen (TYLENOL) 500 MG tablet Take 500 mg by mouth every 6 (six) hours as needed for mild pain.   albuterol (PROVENTIL) (2.5 MG/3ML) 0.083%  nebulizer solution INHALE 1 VIAL BY NEBULIZATION EVERY 4 HOURS AS NEEDED FOR WHEEZING OR SHORTNESS OF BREATH   albuterol (VENTOLIN HFA) 108 (90 Base) MCG/ACT inhaler INHALE 2 PUFFS BY MOUTH EVERY 4 HOURS AS NEEDED   amLODipine (NORVASC) 5 MG tablet TAKE 1 TABLET (5 MG TOTAL) BY MOUTH DAILY.   cetirizine (ZYRTEC) 10 MG tablet Take 10 mg by mouth daily.   doxycycline (VIBRA-TABS) 100 MG tablet Take 1 tablet (100 mg total) by mouth 2 (two) times daily.   fluticasone-salmeterol (WIXELA INHUB) 250-50 MCG/ACT AEPB Inhale 1 puff into the lungs in the morning and at bedtime.   ibuprofen (ADVIL) 200 MG tablet Take 200 mg by mouth every 6 (six) hours as needed for mild pain.   losartan (COZAAR) 50 MG tablet TAKE 1 TABLET (50 MG TOTAL) BY MOUTH DAILY. NEEDS A PHYSICAL EXAM   Misc. Devices (PULSE OXIMETER) MISC 1 Act by Does not apply route as needed.   predniSONE (DELTASONE) 20 MG tablet Take 2 tablets (40 mg total) by mouth daily.   [DISCONTINUED] fluticasone-salmeterol (ADVAIR DISKUS) 250-50 MCG/ACT AEPB Inhale 1 puff into the lungs in the morning and at bedtime.   No facility-administered encounter medications on file as of 02/27/2022.

## 2022-03-04 ENCOUNTER — Other Ambulatory Visit: Payer: Self-pay | Admitting: Internal Medicine

## 2022-04-27 ENCOUNTER — Telehealth: Payer: Self-pay | Admitting: Internal Medicine

## 2022-04-27 NOTE — Telephone Encounter (Signed)
Pt is asking if he can go back on diazepam '5mg'$ . He is under a lot of stress right now taking care of his mother. If he needs an OV first, we can schedule him for next week.

## 2022-04-27 NOTE — Telephone Encounter (Signed)
Pt called asking for a call back from [facility] regarding starting himself back on the meds, diazepam. Call back # [phone]

## 2022-04-28 NOTE — Telephone Encounter (Signed)
Spoke to pt. Made appt for 05-01-22.

## 2022-04-28 NOTE — Telephone Encounter (Signed)
I think it would be best for him to come in to discuss what is going on. It has been 10 years since he had valium that I can see--and I am not sure if that is a good choice for him or not

## 2022-05-01 ENCOUNTER — Encounter: Payer: Self-pay | Admitting: Internal Medicine

## 2022-05-01 ENCOUNTER — Ambulatory Visit: Payer: 59 | Admitting: Internal Medicine

## 2022-05-01 VITALS — BP 138/80 | HR 98 | Temp 97.8°F | Ht 66.0 in | Wt 211.0 lb

## 2022-05-01 DIAGNOSIS — F439 Reaction to severe stress, unspecified: Secondary | ICD-10-CM | POA: Insufficient documentation

## 2022-05-01 MED ORDER — DIAZEPAM 5 MG PO TABS: 5.0000 mg | ORAL_TABLET | Freq: Two times a day (BID) | ORAL | 0 refills | Status: DC | PRN

## 2022-05-01 NOTE — Assessment & Plan Note (Signed)
Having episodic problems with dealing with mom/legal issues/etc No baseline mood problems Will go ahead and refill diazepam '5mg'$  that he used with good result in the past---he knows this is for prn use only

## 2022-05-01 NOTE — Progress Notes (Signed)
Subjective:    Patient ID: Donald Evans, male    DOB: 09-07-1970, 52 y.o.   MRN: [id number]  HPI Here due to stress and wondering about restarting diazepam  Having stress with his mom and her husband His mom overwhelmed with some financial issues Calling him every day to help her with new broker, etc Now she wants to sign over everything--needed wills changed, etc  He had built a add on bedroom for her and husband--but he doesn't want to leave his house (and is 54 and his mom is 85) She has decided to stay with him until he passes  He had stress reaction at the attorney's office---got clammy hands and felt "wound up" (was asking questions that he wasn't ready for) The symptoms did go away over this weekend--but knows he has more paperwork, etc to deal with  No baseline anxiety or depression  Current Outpatient Medications on File Prior to Visit  Medication Sig Dispense Refill   albuterol (PROVENTIL) (2.5 MG/3ML) 0.083% nebulizer solution INHALE 1 VIAL BY NEBULIZATION EVERY 4 HOURS AS NEEDED FOR WHEEZING OR SHORTNESS OF BREATH 75 mL 2   albuterol (VENTOLIN HFA) 108 (90 Base) MCG/ACT inhaler INHALE 2 PUFFS BY MOUTH EVERY 4 HOURS AS NEEDED 8.5 each 1   amLODipine (NORVASC) 5 MG tablet TAKE 1 TABLET (5 MG TOTAL) BY MOUTH DAILY. 90 tablet 3   cetirizine (ZYRTEC) 10 MG tablet Take 10 mg by mouth daily.     fluticasone-salmeterol (WIXELA INHUB) 250-50 MCG/ACT AEPB Inhale 1 puff into the lungs in the morning and at bedtime. 60 each 11   losartan (COZAAR) 50 MG tablet TAKE 1 TABLET (50 MG TOTAL) BY MOUTH DAILY. NEEDS A PHYSICAL EXAM 90 tablet 3   No current facility-administered medications on file prior to visit.    Allergies  Allergen Reactions   Hctz [Hydrochlorothiazide]     Gout attack    Past Medical History:  Diagnosis Date   Allergy    GERD (gastroesophageal reflux disease)    Gout    on HCTZ   Hyperlipidemia    Hypertension    Sarcoidosis     Stage 1 sarcoidosis  - at age 34.  Spontaneous remission    Past Surgical History:  Procedure Laterality Date   TOTAL HIP ARTHROPLASTY Left 2021   Alusio    History reviewed. No pertinent family history.  Social History   Socioeconomic History   Marital status: Married    Spouse name: Not on file   Number of children: 1   Years of education: Not on file   Highest education level: Not on file  Occupational History   Occupation: Building maintenance    Employer: [employer]  Tobacco Use   Smoking status: Former    Packs/day: 1.00    Years: 10.00    Total pack years: 10.00    Types: Cigarettes    Quit date: 11/03/2004    Years since quitting: 17.5    Passive exposure: Past   Smokeless tobacco: Never  Substance and Sexual Activity   Alcohol use: Yes    Comment: Occasional to regular   Drug use: No   Sexual activity: Not on file  Other Topics Concern   Not on file  Social History Narrative   Not on file   Social Determinants of Health   Financial Resource Strain: Not on file  Food Insecurity: Not on file  Transportation Needs: Not on file  Physical Activity: Not on file  Stress:  Not on file  Social Connections: Not on file  Intimate Partner Violence: Not on file   Review of Systems Sleeps well Appetite is fine No problems at work     Objective:   Physical Exam Constitutional:      Appearance: Normal appearance.  Neurological:     Mental Status: He is alert.  Psychiatric:        Mood and Affect: Mood normal.        Behavior: Behavior normal.            Assessment & Plan:

## 2022-07-08 ENCOUNTER — Other Ambulatory Visit: Payer: Self-pay | Admitting: Internal Medicine

## 2022-08-01 ENCOUNTER — Encounter: Payer: Self-pay | Admitting: Internal Medicine

## 2022-11-30 IMAGING — DX DG CHEST 1V PORT
1 series · 1 of 1 positions shown · non-contrast
Comparison: 06/06/2006 chest radiograph.

CLINICAL DATA: Questionable sepsis.  Dyspnea.

EXAM:
PORTABLE CHEST 1 VIEW

[chest]
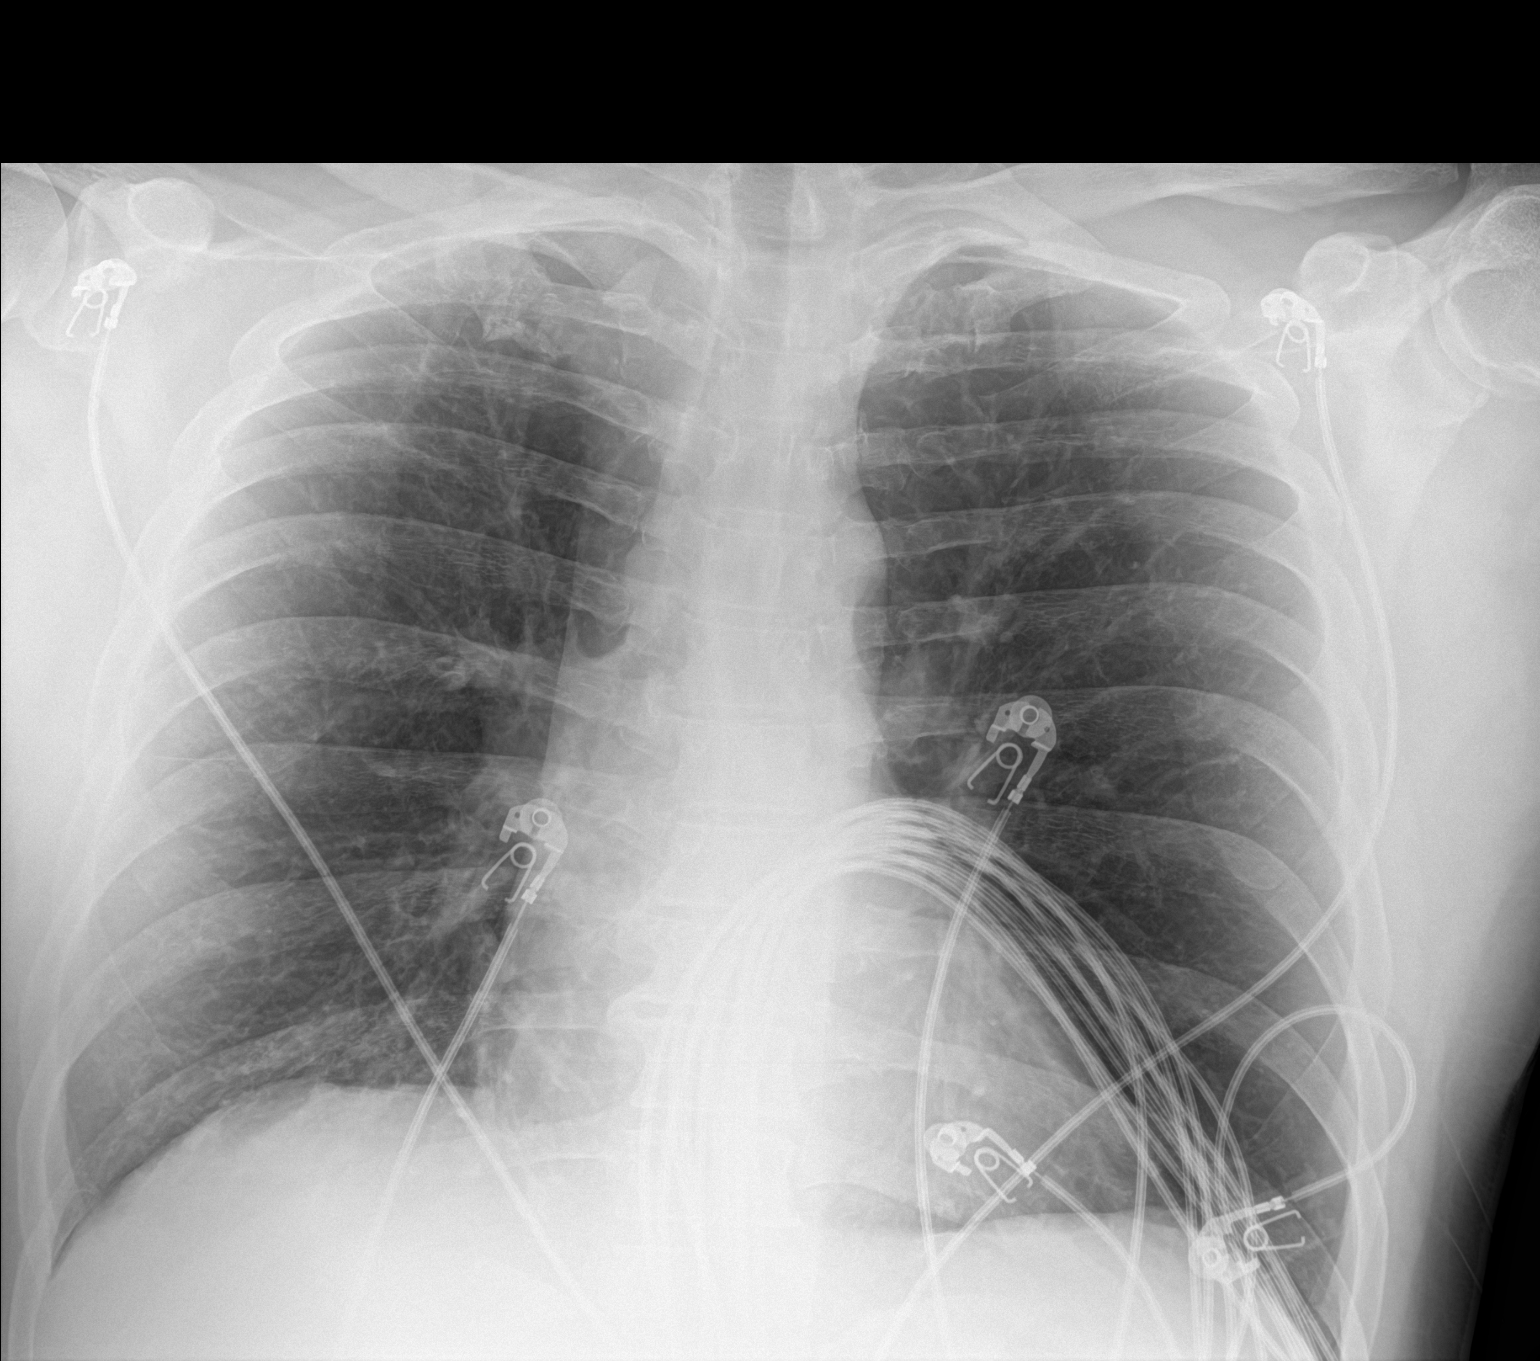

[1 of 1 positions shown; findings below may reference images not displayed]

FINDINGS: Stable cardiomediastinal silhouette with normal heart size. No
pneumothorax. No pleural effusion. Lungs appear clear, with no acute
consolidative airspace disease and no pulmonary edema.
IMPRESSION: No active disease.

## 2023-01-19 ENCOUNTER — Encounter: Payer: 59 | Admitting: Internal Medicine

## 2023-01-22 ENCOUNTER — Encounter: Payer: Self-pay | Admitting: Internal Medicine

## 2023-01-22 ENCOUNTER — Ambulatory Visit: Payer: 59 | Admitting: Internal Medicine

## 2023-01-22 VITALS — BP 124/84 | HR 97 | Temp 98.7°F | Ht 66.5 in | Wt 207.0 lb

## 2023-01-22 DIAGNOSIS — E785 Hyperlipidemia, unspecified: Secondary | ICD-10-CM | POA: Diagnosis not present

## 2023-01-22 DIAGNOSIS — I10 Essential (primary) hypertension: Secondary | ICD-10-CM

## 2023-01-22 DIAGNOSIS — J4540 Moderate persistent asthma, uncomplicated: Secondary | ICD-10-CM | POA: Diagnosis not present

## 2023-01-22 DIAGNOSIS — Z0000 Encounter for general adult medical examination without abnormal findings: Secondary | ICD-10-CM

## 2023-01-22 DIAGNOSIS — Z1211 Encounter for screening for malignant neoplasm of colon: Secondary | ICD-10-CM

## 2023-01-22 LAB — CBC
HCT: 47.7 % (ref 39.0–52.0)
Hemoglobin: 15.5 g/dL (ref 13.0–17.0)
MCHC: 32.5 g/dL (ref 30.0–36.0)
MCV: 91.1 fL (ref 78.0–100.0)
Platelets: 253 10*3/uL (ref 150.0–400.0)
RBC: 5.23 Mil/uL (ref 4.22–5.81)
RDW: 15.6 % — ABNORMAL HIGH (ref 11.5–15.5)
WBC: 6.1 10*3/uL (ref 4.0–10.5)

## 2023-01-22 LAB — COMPREHENSIVE METABOLIC PANEL
ALT: 37 U/L (ref 0–53)
AST: 25 U/L (ref 0–37)
Albumin: 4.8 g/dL (ref 3.5–5.2)
Alkaline Phosphatase: 63 U/L (ref 39–117)
BUN: 16 mg/dL (ref 6–23)
CO2: 24 meq/L (ref 19–32)
Calcium: 9.4 mg/dL (ref 8.4–10.5)
Chloride: 103 meq/L (ref 96–112)
Creatinine, Ser: 1.11 mg/dL (ref 0.40–1.50)
GFR: 75.18 mL/min (ref >60.00)
Glucose, Bld: 92 mg/dL (ref 70–99)
Potassium: 4.4 meq/L (ref 3.5–5.1)
Sodium: 140 meq/L (ref 135–145)
Total Bilirubin: 0.6 mg/dL (ref 0.2–1.2)
Total Protein: 7.4 g/dL (ref 6.0–8.3)

## 2023-01-22 MED ORDER — DIAZEPAM 5 MG PO TABS: 5.0000 mg | ORAL_TABLET | Freq: Two times a day (BID) | ORAL | 0 refills | Status: DC | PRN

## 2023-01-22 MED ORDER — ALBUTEROL SULFATE HFA 108 (90 BASE) MCG/ACT IN AERS: 2.0000 | INHALATION_SPRAY | RESPIRATORY_TRACT | 1 refills | Status: DC | PRN

## 2023-01-22 NOTE — Assessment & Plan Note (Signed)
Quiet on wixela bid and cetirizine

## 2023-01-22 NOTE — Assessment & Plan Note (Signed)
Controlled with amlodipine 5mg  daily  BP Readings from Last 3 Encounters:  01/22/23 124/84  05/01/22 138/80  02/27/22 130/80

## 2023-01-22 NOTE — Assessment & Plan Note (Signed)
Was better last year

## 2023-01-22 NOTE — Assessment & Plan Note (Signed)
Healthy Has been working on Information systems manager Will try to set up the colonoscopy again Defer PSA Still prefers no flu/COVID/shingrix vaccines

## 2023-01-22 NOTE — Progress Notes (Signed)
Subjective:    Patient ID: Donald Evans, male    DOB: 1970/12/04, 52 y.o.   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 90 tablet 3   cetirizine (ZYRTEC) 10 MG tablet Take 10 mg by mouth daily.     diazepam (VALIUM) 5 MG tablet Take 1 tablet (5 mg total) by mouth 2 (two) times daily as needed for anxiety. 30 tablet 0   fluticasone-salmeterol (WIXELA INHUB) 250-50 MCG/ACT AEPB Inhale 1 puff into the lungs in the morning and at bedtime. 60 each 11   losartan (COZAAR) 50 MG tablet TAKE 1 TABLET (50 MG TOTAL) BY MOUTH DAILY. NEEDS A PHYSICAL EXAM 90 tablet 3   No current facility-administered medications on file prior to visit.    Allergies  Allergen Reactions   Hctz [Hydrochlorothiazide]     Gout attack    Past Medical History:  Diagnosis Date   Allergy    GERD (gastroesophageal reflux disease)    Gout    on HCTZ   Hyperlipidemia    Hypertension    Sarcoidosis     Stage 1 sarcoidosis - at age 67.  Spontaneous remission    Past Surgical History:  Procedure Laterality Date   TOTAL HIP ARTHROPLASTY Left 2021   Alusio    Family History  Problem Relation Age of Onset   Heart disease Mother    Sick sinus syndrome Mother     Social History   Socioeconomic History   Marital status: Married    Spouse name: Not on file   Number of children: 1   Years of education: Not on file    Highest education level: Not on file  Occupational History   Occupation: Building maintenance    Employer: [employer]  Tobacco Use   Smoking status: Former    Current packs/day: 0.00    Average packs/day: 1 pack/day for 10.0 years (10.0 ttl pk-yrs)    Types: Cigarettes    Start date: 11/04/1994    Quit date: 11/03/2004    Years since quitting: 18.2    Passive exposure: Past   Smokeless tobacco: Never  Substance and Sexual Activity   Alcohol use: Yes    Comment: Occasional to regular   Drug use: No   Sexual activity: Not on file  Other Topics Concern   Not on file  Social History Narrative   Not on file   Social Determinants of Health   Financial Resource Strain: Not on file  Food Insecurity: Not on file  Transportation Needs: Not on file  Physical Activity: Not on file  Stress: Not on file  Social Connections: Not on file  Intimate Partner Violence: Not on file   Review of Systems  Constitutional:  Negative for fatigue.       Weight down a little Wears seat belt  HENT:  Negative for dental problem, hearing  loss, tinnitus and trouble swallowing.        Keeps up with dentist   Eyes:  Negative for visual disturbance.       No diplopia or unilateral vision loss Has readers now  Respiratory:  Positive for cough. Negative for chest tightness and wheezing.        Mild episodic SOB--inhaler will help  Cardiovascular:  Negative for chest pain and leg swelling.       Some palpitations with increased exertion  Gastrointestinal:  Negative for blood in stool and constipation.       Some heartburn --tomato/sauce provokes. No meds  Endocrine: Negative for polydipsia and polyuria.  Genitourinary:  Negative for difficulty urinating and urgency.       No sexual problems  Musculoskeletal:  Negative for arthralgias, back pain and joint swelling.  Skin:  Negative for rash.  Allergic/Immunologic: Positive for environmental allergies. Negative for immunocompromised state.        Uses zyrtec daily  Neurological:  Negative for dizziness, syncope, light-headedness and headaches.  Hematological:  Negative for adenopathy. Does not bruise/bleed easily.  Psychiatric/Behavioral:  Negative for dysphoric mood and sleep disturbance. The patient is not nervous/anxious.        Objective:   Physical Exam Constitutional:      Appearance: Normal appearance.  HENT:     Mouth/Throat:     Pharynx: No oropharyngeal exudate or posterior oropharyngeal erythema.  Eyes:     Conjunctiva/sclera: Conjunctivae normal.     Pupils: Pupils are equal, round, and reactive to light.  Cardiovascular:     Rate and Rhythm: Normal rate and regular rhythm.     Pulses: Normal pulses.     Heart sounds: No murmur heard.    No gallop.  Pulmonary:     Effort: Pulmonary effort is normal.     Breath sounds: Normal breath sounds. No wheezing or rales.  Abdominal:     Palpations: Abdomen is soft.     Tenderness: There is no abdominal tenderness.  Musculoskeletal:     Cervical back: Neck supple.     Right lower leg: No edema.     Left lower leg: No edema.  Lymphadenopathy:     Cervical: No cervical adenopathy.  Skin:    Findings: No lesion or rash.  Neurological:     General: No focal deficit present.     Mental Status: He is alert and oriented to person, place, and time.  Psychiatric:        Mood and Affect: Mood normal.        Behavior: Behavior normal.            Assessment & Plan:

## 2023-01-27 ENCOUNTER — Other Ambulatory Visit: Payer: Self-pay | Admitting: Internal Medicine

## 2023-02-15 ENCOUNTER — Other Ambulatory Visit: Payer: Self-pay | Admitting: Internal Medicine

## 2023-03-16 ENCOUNTER — Other Ambulatory Visit: Payer: Self-pay | Admitting: Internal Medicine

## 2023-03-16 ENCOUNTER — Other Ambulatory Visit: Payer: Self-pay | Admitting: Family Medicine

## 2023-03-16 DIAGNOSIS — J4540 Moderate persistent asthma, uncomplicated: Secondary | ICD-10-CM

## 2023-03-21 ENCOUNTER — Encounter: Payer: Self-pay | Admitting: Internal Medicine

## 2023-03-21 ENCOUNTER — Ambulatory Visit: Payer: 59 | Admitting: Internal Medicine

## 2023-03-21 VITALS — BP 114/70 | HR 94 | Temp 98.3°F | Ht 65.5 in | Wt 204.0 lb

## 2023-03-21 DIAGNOSIS — K529 Noninfective gastroenteritis and colitis, unspecified: Secondary | ICD-10-CM | POA: Insufficient documentation

## 2023-03-21 NOTE — Assessment & Plan Note (Signed)
Likely viral (?norovirus) Doubt it was related to the steamed oyster No N/V Able to drink--discussed slowly advancing diet No Rx indicated for now (no imodium)

## 2023-03-21 NOTE — Progress Notes (Signed)
Subjective:    Patient ID: Donald Evans, male    DOB: [birth year], 53 y.o.   MRN: [id number]  HPI Here due to GI illness  Had oysters 1/24--along with wife. Steamed 1/25 in afternoon---stomach pain started (like cramp) Vomited that night 3 days ago---lots of diarrhea and has persisted Fewer spells but still liquid (every hour when awake) No blood No pain now No nausea now  Has had some soup and saltine crackers Taking liquids ---including pedialyte  Has missed the last 3 days of work  Current Outpatient Medications on File Prior to Visit  Medication Sig Dispense Refill   albuterol (PROVENTIL) (2.5 MG/3ML) 0.083% nebulizer solution INHALE 1 VIAL BY NEBULIZATION EVERY 4 HOURS AS NEEDED FOR WHEEZING OR SHORTNESS OF BREATH 75 mL 2   albuterol (VENTOLIN HFA) 108 (90 Base) MCG/ACT inhaler Inhale 2 puffs into the lungs every 4 (four) hours as needed. 8.5 each 1   amLODipine (NORVASC) 5 MG tablet TAKE 1 TABLET (5 MG TOTAL) BY MOUTH DAILY. 90 tablet 3   cetirizine (ZYRTEC) 10 MG tablet Take 10 mg by mouth daily.     diazepam (VALIUM) 5 MG tablet Take 1 tablet (5 mg total) by mouth 2 (two) times daily as needed for anxiety. 30 tablet 0   losartan (COZAAR) 50 MG tablet Take 1 tablet (50 mg total) by mouth daily. 90 tablet 3   WIXELA INHUB 250-50 MCG/ACT AEPB INHALE 1 PUFF INTO THE LUNGS IN THE MORNING AND AT BEDTIME. 60 each 11   No current facility-administered medications on file prior to visit.    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    Past Surgical History:  Procedure Laterality Date   TOTAL HIP ARTHROPLASTY Left 2021   [facility]    Family History  Problem Relation Age of Onset   Heart disease Mother     Sick sinus syndrome Mother     Social History   Socioeconomic History   Marital status: Married    Spouse name: Not on file   Number of children: 1   Years of education: Not on file   Highest education level: Not on file  Occupational History   Occupation: Building maintenance    Employer: [employer]  Tobacco Use   Smoking status: Former    Current packs/day: 0.00    Average packs/day: 1 pack/day for 10.0 years (10.0 ttl pk-yrs)    Types: Cigarettes    Start date: 11/04/1994    Quit date: 11/03/2004    Years since quitting: 18.3    Passive exposure: Past   Smokeless tobacco: Never  Substance and Sexual Activity   Alcohol use: Yes    Comment: Occasional to regular   Drug use: No   Sexual activity: Not on file  Other Topics Concern   Not on file  Social History Narrative   Not on file   Social Drivers of Health   Financial Resource Strain: Not on file  Food Insecurity: Not on file  Transportation Needs: Not on file  Physical Activity: Not on file  Stress: Not on file  Social Connections: Not on file  Intimate Partner Violence:  Not on file   Review of Systems No dizziness Fever at first--now in past 2 days Has needed hearing blanket in bed and sweat it out (gone since yesterday) No cough or SOB---other than his usual     Objective:   Physical Exam Constitutional:      Appearance: Normal appearance.  Pulmonary:     Effort: Pulmonary effort is normal.     Breath sounds: Normal breath sounds. No wheezing or rales.  Abdominal:     General: There is no distension.     Palpations: Abdomen is soft.     Tenderness: There is no abdominal tenderness. There is no guarding or rebound.     Comments: Decreased but present bowel sounds  Musculoskeletal:     Cervical back: Neck supple.  Lymphadenopathy:     Cervical: No cervical adenopathy.  Neurological:     Mental Status: He is alert.            Assessment & Plan:

## 2023-04-27 ENCOUNTER — Other Ambulatory Visit: Payer: Self-pay | Admitting: Internal Medicine

## 2023-04-27 MED ORDER — DIAZEPAM 5 MG PO TABS: 5.0000 mg | ORAL_TABLET | Freq: Two times a day (BID) | ORAL | 0 refills | Status: AC | PRN

## 2023-04-27 MED ORDER — ALBUTEROL SULFATE HFA 108 (90 BASE) MCG/ACT IN AERS: 2.0000 | INHALATION_SPRAY | RESPIRATORY_TRACT | 1 refills | Status: DC | PRN

## 2023-04-27 NOTE — Telephone Encounter (Signed)
 Spoke to pt to see if he has filled the inhaler since December. He said he keeps 1 in his work [employer] and 1 at home. He needs 1 to replace the lost one. We have sent in a new rx for the inhaler.   Diazepam last filled 01-22-23 #30 Last OV 03-21-23 Next OV 01-24-24 [facility]

## 2023-04-27 NOTE — Addendum Note (Signed)
 Addended by: Tillman Abide I on: 04/27/2023 01:21 PM   Modules accepted: Orders

## 2023-04-27 NOTE — Telephone Encounter (Signed)
 Copied from CRM [id number]. Topic: Clinical - Prescription Issue >> Apr 27, 2023 10:55 AM [place] E wrote: Reason for CRM: Patient called in stating that he lost his inhaler and was questioning if his PCP could prescribe him another one, albuterol (VENTOLIN HFA) 108 (90 Base) MCG/ACT inhaler. Patient also stated he is out of refills for his diazepam (VALIUM) 5 MG tablet medication and PCP would have to sign off for more refills of that as well.

## 2023-04-27 NOTE — Addendum Note (Signed)
 Addended by: Eual Fines on: 04/27/2023 11:26 AM   Modules accepted: Orders

## 2023-07-29 ENCOUNTER — Other Ambulatory Visit: Payer: Self-pay | Admitting: Internal Medicine

## 2023-10-04 ENCOUNTER — Other Ambulatory Visit: Payer: Self-pay | Admitting: Internal Medicine

## 2024-01-24 ENCOUNTER — Encounter: Payer: 59 | Admitting: Nurse Practitioner

## 2024-01-24 NOTE — Progress Notes (Deleted)
   Established Patient Office Visit  Subjective   Patient ID: PENIEL HASS, male    DOB: Nov 02, 1970  Age: 53 y.o. MRN: [id number]  No chief complaint on file.   HPI  Asthma: Patient currently maintained on Wixela 250-51 puff twice daily and albuterol  inhaler as needed  HTN: Patient currently maintained on amlodipine  5 mg daily and losartan  50 mg daily.  for complete physical and follow up of chronic conditions.  Immunizations: -Tetanus: Completed in 2019 -Influenza:?  Declined in the past -Shingles:?  Declined in the past -Pneumonia:? - COVID: Original series  Diet: Fair diet.  Exercise: No regular exercise.  Eye exam: Completes annually  Dental exam: Completes semi-annually    Colonoscopy: Has been referred but never completed Lung Cancer Screening: Completed in   PSA: Due  Sleep:     {History (Optional):23778}  ROS    Objective:     There were no vitals taken for this visit. {Vitals History (Optional):23777}  Physical Exam   No results found for any visits on 01/24/24.  {Labs (Optional):23779}  The 10-year ASCVD risk score (Arnett DK, et al., 2019) is: 7.4%    Assessment & Plan:   Problem List Items Addressed This Visit   None   No follow-ups on file.    Adina Crandall, NP
